# Patient Record
Sex: Female | Born: 1945 | Race: White | Hispanic: No | Marital: Married | Smoking: Never smoker
Health system: Southern US, Community
[De-identification: ages and names within clinical notes are randomized; demographics above are authoritative.]

## PROBLEM LIST (undated history)

## (undated) DIAGNOSIS — M810 Age-related osteoporosis without current pathological fracture: Secondary | ICD-10-CM

## (undated) DIAGNOSIS — N390 Urinary tract infection, site not specified: Secondary | ICD-10-CM

## (undated) DIAGNOSIS — I480 Paroxysmal atrial fibrillation: Secondary | ICD-10-CM

## (undated) DIAGNOSIS — E559 Vitamin D deficiency, unspecified: Secondary | ICD-10-CM

## (undated) HISTORY — PX: VAGINAL HYSTERECTOMY: SUR661

## (undated) HISTORY — PX: BLADDER SURGERY: SHX569

## (undated) HISTORY — DX: Paroxysmal atrial fibrillation: I48.0

## (undated) HISTORY — DX: Urinary tract infection, site not specified: N39.0

## (undated) HISTORY — DX: Age-related osteoporosis without current pathological fracture: M81.0

## (undated) HISTORY — DX: Vitamin D deficiency, unspecified: E55.9

---

## 1997-08-03 ENCOUNTER — Other Ambulatory Visit: Admission: RE | Admit: 1997-08-03 | Discharge: 1997-08-03 | Payer: Self-pay | Admitting: Obstetrics and Gynecology

## 1997-09-29 ENCOUNTER — Other Ambulatory Visit: Admission: RE | Admit: 1997-09-29 | Discharge: 1997-09-29 | Payer: Self-pay | Admitting: Obstetrics and Gynecology

## 1998-03-29 ENCOUNTER — Other Ambulatory Visit: Admission: RE | Admit: 1998-03-29 | Discharge: 1998-03-29 | Payer: Self-pay | Admitting: Obstetrics and Gynecology

## 1998-05-18 ENCOUNTER — Inpatient Hospital Stay (HOSPITAL_COMMUNITY): Admission: RE | Admit: 1998-05-18 | Discharge: 1998-05-19 | Payer: Self-pay | Admitting: Obstetrics and Gynecology

## 1999-07-05 ENCOUNTER — Other Ambulatory Visit: Admission: RE | Admit: 1999-07-05 | Discharge: 1999-07-05 | Payer: Self-pay | Admitting: Obstetrics and Gynecology

## 2000-09-28 ENCOUNTER — Other Ambulatory Visit: Admission: RE | Admit: 2000-09-28 | Discharge: 2000-09-28 | Payer: Self-pay | Admitting: Obstetrics and Gynecology

## 2002-03-11 ENCOUNTER — Other Ambulatory Visit: Admission: RE | Admit: 2002-03-11 | Discharge: 2002-03-11 | Payer: Self-pay | Admitting: Obstetrics and Gynecology

## 2003-07-31 ENCOUNTER — Encounter: Admission: RE | Admit: 2003-07-31 | Discharge: 2003-07-31 | Payer: Self-pay | Admitting: Obstetrics and Gynecology

## 2005-02-23 ENCOUNTER — Other Ambulatory Visit: Admission: RE | Admit: 2005-02-23 | Discharge: 2005-02-23 | Payer: Self-pay | Admitting: Obstetrics and Gynecology

## 2013-06-02 ENCOUNTER — Other Ambulatory Visit: Payer: Self-pay | Admitting: Orthopaedic Surgery

## 2013-06-02 DIAGNOSIS — S22009A Unspecified fracture of unspecified thoracic vertebra, initial encounter for closed fracture: Secondary | ICD-10-CM

## 2013-08-29 ENCOUNTER — Other Ambulatory Visit: Payer: Self-pay | Admitting: Obstetrics and Gynecology

## 2013-08-29 DIAGNOSIS — R928 Other abnormal and inconclusive findings on diagnostic imaging of breast: Secondary | ICD-10-CM

## 2013-09-08 ENCOUNTER — Ambulatory Visit
Admission: RE | Admit: 2013-09-08 | Discharge: 2013-09-08 | Disposition: A | Discharge status: Home / Self Care | Payer: Medicare PPO | Source: Ambulatory Visit | Attending: Obstetrics and Gynecology | Admitting: Obstetrics and Gynecology

## 2013-09-08 ENCOUNTER — Encounter (INDEPENDENT_AMBULATORY_CARE_PROVIDER_SITE_OTHER): Payer: Self-pay

## 2013-09-08 ENCOUNTER — Ambulatory Visit
Admission: RE | Admit: 2013-09-08 | Discharge: 2013-09-08 | Disposition: A | Discharge status: Home / Self Care | Payer: Self-pay | Source: Ambulatory Visit | Attending: Obstetrics and Gynecology | Admitting: Obstetrics and Gynecology

## 2013-09-08 DIAGNOSIS — R928 Other abnormal and inconclusive findings on diagnostic imaging of breast: Secondary | ICD-10-CM

## 2014-09-02 ENCOUNTER — Other Ambulatory Visit: Payer: Self-pay | Admitting: Family Medicine

## 2014-09-02 DIAGNOSIS — E785 Hyperlipidemia, unspecified: Secondary | ICD-10-CM | POA: Diagnosis not present

## 2014-09-02 DIAGNOSIS — R922 Inconclusive mammogram: Secondary | ICD-10-CM | POA: Diagnosis not present

## 2014-09-02 DIAGNOSIS — N390 Urinary tract infection, site not specified: Secondary | ICD-10-CM | POA: Diagnosis not present

## 2014-09-02 DIAGNOSIS — Z1231 Encounter for screening mammogram for malignant neoplasm of breast: Secondary | ICD-10-CM

## 2014-09-02 DIAGNOSIS — E038 Other specified hypothyroidism: Secondary | ICD-10-CM | POA: Diagnosis not present

## 2014-10-01 ENCOUNTER — Ambulatory Visit
Admission: RE | Admit: 2014-10-01 | Discharge: 2014-10-01 | Disposition: A | Discharge status: Home / Self Care | Payer: Medicare PPO | Source: Ambulatory Visit | Attending: Family Medicine | Admitting: Family Medicine

## 2014-10-01 DIAGNOSIS — Z1231 Encounter for screening mammogram for malignant neoplasm of breast: Secondary | ICD-10-CM

## 2014-11-19 DIAGNOSIS — J189 Pneumonia, unspecified organism: Secondary | ICD-10-CM | POA: Diagnosis not present

## 2014-11-19 DIAGNOSIS — B373 Candidiasis of vulva and vagina: Secondary | ICD-10-CM | POA: Diagnosis not present

## 2014-11-19 DIAGNOSIS — J301 Allergic rhinitis due to pollen: Secondary | ICD-10-CM | POA: Diagnosis not present

## 2014-11-25 DIAGNOSIS — Z6823 Body mass index (BMI) 23.0-23.9, adult: Secondary | ICD-10-CM | POA: Diagnosis not present

## 2014-11-25 DIAGNOSIS — J301 Allergic rhinitis due to pollen: Secondary | ICD-10-CM | POA: Diagnosis not present

## 2014-11-25 DIAGNOSIS — J189 Pneumonia, unspecified organism: Secondary | ICD-10-CM | POA: Diagnosis not present

## 2015-04-13 DIAGNOSIS — M546 Pain in thoracic spine: Secondary | ICD-10-CM | POA: Diagnosis not present

## 2015-04-29 DIAGNOSIS — Z780 Asymptomatic menopausal state: Secondary | ICD-10-CM | POA: Diagnosis not present

## 2015-05-04 DIAGNOSIS — M546 Pain in thoracic spine: Secondary | ICD-10-CM | POA: Diagnosis not present

## 2015-05-06 DIAGNOSIS — M546 Pain in thoracic spine: Secondary | ICD-10-CM | POA: Diagnosis not present

## 2015-05-06 DIAGNOSIS — M810 Age-related osteoporosis without current pathological fracture: Secondary | ICD-10-CM | POA: Diagnosis not present

## 2015-05-10 DIAGNOSIS — M546 Pain in thoracic spine: Secondary | ICD-10-CM | POA: Diagnosis not present

## 2015-05-10 DIAGNOSIS — M8580 Other specified disorders of bone density and structure, unspecified site: Secondary | ICD-10-CM | POA: Diagnosis not present

## 2015-05-10 DIAGNOSIS — M810 Age-related osteoporosis without current pathological fracture: Secondary | ICD-10-CM | POA: Diagnosis not present

## 2015-05-12 DIAGNOSIS — M546 Pain in thoracic spine: Secondary | ICD-10-CM | POA: Diagnosis not present

## 2015-05-17 DIAGNOSIS — M546 Pain in thoracic spine: Secondary | ICD-10-CM | POA: Diagnosis not present

## 2015-05-19 DIAGNOSIS — M546 Pain in thoracic spine: Secondary | ICD-10-CM | POA: Diagnosis not present

## 2015-05-24 DIAGNOSIS — M546 Pain in thoracic spine: Secondary | ICD-10-CM | POA: Diagnosis not present

## 2015-05-26 DIAGNOSIS — M546 Pain in thoracic spine: Secondary | ICD-10-CM | POA: Diagnosis not present

## 2015-06-02 DIAGNOSIS — M546 Pain in thoracic spine: Secondary | ICD-10-CM | POA: Diagnosis not present

## 2015-08-19 DIAGNOSIS — E559 Vitamin D deficiency, unspecified: Secondary | ICD-10-CM | POA: Diagnosis not present

## 2015-08-19 DIAGNOSIS — R8290 Unspecified abnormal findings in urine: Secondary | ICD-10-CM | POA: Diagnosis not present

## 2015-08-19 DIAGNOSIS — R079 Chest pain, unspecified: Secondary | ICD-10-CM | POA: Diagnosis not present

## 2015-08-19 DIAGNOSIS — Z79899 Other long term (current) drug therapy: Secondary | ICD-10-CM | POA: Diagnosis not present

## 2015-08-19 DIAGNOSIS — E038 Other specified hypothyroidism: Secondary | ICD-10-CM | POA: Diagnosis not present

## 2015-08-19 DIAGNOSIS — R5382 Chronic fatigue, unspecified: Secondary | ICD-10-CM | POA: Diagnosis not present

## 2015-08-19 DIAGNOSIS — E785 Hyperlipidemia, unspecified: Secondary | ICD-10-CM | POA: Diagnosis not present

## 2015-08-19 DIAGNOSIS — Z6823 Body mass index (BMI) 23.0-23.9, adult: Secondary | ICD-10-CM | POA: Diagnosis not present

## 2015-08-19 DIAGNOSIS — Z0000 Encounter for general adult medical examination without abnormal findings: Secondary | ICD-10-CM | POA: Diagnosis not present

## 2015-10-01 DIAGNOSIS — N390 Urinary tract infection, site not specified: Secondary | ICD-10-CM | POA: Diagnosis not present

## 2015-10-01 DIAGNOSIS — Z9181 History of falling: Secondary | ICD-10-CM | POA: Diagnosis not present

## 2015-10-01 DIAGNOSIS — Z6823 Body mass index (BMI) 23.0-23.9, adult: Secondary | ICD-10-CM | POA: Diagnosis not present

## 2015-10-01 DIAGNOSIS — B373 Candidiasis of vulva and vagina: Secondary | ICD-10-CM | POA: Diagnosis not present

## 2016-02-07 DIAGNOSIS — E559 Vitamin D deficiency, unspecified: Secondary | ICD-10-CM | POA: Diagnosis not present

## 2016-02-07 DIAGNOSIS — I10 Essential (primary) hypertension: Secondary | ICD-10-CM | POA: Diagnosis not present

## 2016-02-07 DIAGNOSIS — E785 Hyperlipidemia, unspecified: Secondary | ICD-10-CM | POA: Diagnosis not present

## 2016-02-07 DIAGNOSIS — M549 Dorsalgia, unspecified: Secondary | ICD-10-CM | POA: Diagnosis not present

## 2016-02-07 DIAGNOSIS — N390 Urinary tract infection, site not specified: Secondary | ICD-10-CM | POA: Diagnosis not present

## 2016-02-07 DIAGNOSIS — Z79899 Other long term (current) drug therapy: Secondary | ICD-10-CM | POA: Diagnosis not present

## 2016-09-04 DIAGNOSIS — E063 Autoimmune thyroiditis: Secondary | ICD-10-CM | POA: Diagnosis not present

## 2016-09-04 DIAGNOSIS — M818 Other osteoporosis without current pathological fracture: Secondary | ICD-10-CM | POA: Diagnosis not present

## 2016-09-04 DIAGNOSIS — F329 Major depressive disorder, single episode, unspecified: Secondary | ICD-10-CM | POA: Diagnosis not present

## 2016-09-04 DIAGNOSIS — Z79899 Other long term (current) drug therapy: Secondary | ICD-10-CM | POA: Diagnosis not present

## 2016-09-04 DIAGNOSIS — I10 Essential (primary) hypertension: Secondary | ICD-10-CM | POA: Diagnosis not present

## 2016-09-04 DIAGNOSIS — E559 Vitamin D deficiency, unspecified: Secondary | ICD-10-CM | POA: Diagnosis not present

## 2016-09-04 DIAGNOSIS — E538 Deficiency of other specified B group vitamins: Secondary | ICD-10-CM | POA: Diagnosis not present

## 2016-09-04 DIAGNOSIS — Z6823 Body mass index (BMI) 23.0-23.9, adult: Secondary | ICD-10-CM | POA: Diagnosis not present

## 2016-09-04 DIAGNOSIS — E785 Hyperlipidemia, unspecified: Secondary | ICD-10-CM | POA: Diagnosis not present

## 2016-10-02 DIAGNOSIS — N390 Urinary tract infection, site not specified: Secondary | ICD-10-CM | POA: Diagnosis not present

## 2016-10-02 DIAGNOSIS — E559 Vitamin D deficiency, unspecified: Secondary | ICD-10-CM | POA: Diagnosis not present

## 2016-10-02 DIAGNOSIS — E063 Autoimmune thyroiditis: Secondary | ICD-10-CM | POA: Diagnosis not present

## 2016-10-02 DIAGNOSIS — I10 Essential (primary) hypertension: Secondary | ICD-10-CM | POA: Diagnosis not present

## 2016-10-02 DIAGNOSIS — F329 Major depressive disorder, single episode, unspecified: Secondary | ICD-10-CM | POA: Diagnosis not present

## 2016-10-02 DIAGNOSIS — E785 Hyperlipidemia, unspecified: Secondary | ICD-10-CM | POA: Diagnosis not present

## 2016-10-02 DIAGNOSIS — M818 Other osteoporosis without current pathological fracture: Secondary | ICD-10-CM | POA: Diagnosis not present

## 2016-10-02 DIAGNOSIS — R300 Dysuria: Secondary | ICD-10-CM | POA: Diagnosis not present

## 2016-10-02 DIAGNOSIS — J301 Allergic rhinitis due to pollen: Secondary | ICD-10-CM | POA: Diagnosis not present

## 2016-10-20 DIAGNOSIS — B3749 Other urogenital candidiasis: Secondary | ICD-10-CM | POA: Diagnosis not present

## 2016-12-06 DIAGNOSIS — E063 Autoimmune thyroiditis: Secondary | ICD-10-CM | POA: Diagnosis not present

## 2017-03-01 DIAGNOSIS — M545 Low back pain: Secondary | ICD-10-CM | POA: Diagnosis not present

## 2017-03-01 DIAGNOSIS — M546 Pain in thoracic spine: Secondary | ICD-10-CM | POA: Diagnosis not present

## 2017-03-01 DIAGNOSIS — M4716 Other spondylosis with myelopathy, lumbar region: Secondary | ICD-10-CM | POA: Diagnosis not present

## 2017-03-01 DIAGNOSIS — Z6824 Body mass index (BMI) 24.0-24.9, adult: Secondary | ICD-10-CM | POA: Diagnosis not present

## 2017-03-07 ENCOUNTER — Other Ambulatory Visit: Payer: Self-pay | Admitting: Orthopaedic Surgery

## 2017-03-07 DIAGNOSIS — M4716 Other spondylosis with myelopathy, lumbar region: Secondary | ICD-10-CM

## 2017-03-11 ENCOUNTER — Ambulatory Visit
Admission: RE | Admit: 2017-03-11 | Discharge: 2017-03-11 | Disposition: A | Discharge status: Home / Self Care | Payer: Medicare PPO | Source: Ambulatory Visit | Attending: Orthopaedic Surgery | Admitting: Orthopaedic Surgery

## 2017-03-11 DIAGNOSIS — M48061 Spinal stenosis, lumbar region without neurogenic claudication: Secondary | ICD-10-CM | POA: Diagnosis not present

## 2017-03-11 DIAGNOSIS — M4716 Other spondylosis with myelopathy, lumbar region: Secondary | ICD-10-CM

## 2017-03-15 DIAGNOSIS — M545 Low back pain: Secondary | ICD-10-CM | POA: Diagnosis not present

## 2017-03-15 DIAGNOSIS — M5416 Radiculopathy, lumbar region: Secondary | ICD-10-CM | POA: Diagnosis not present

## 2017-03-15 DIAGNOSIS — M47816 Spondylosis without myelopathy or radiculopathy, lumbar region: Secondary | ICD-10-CM | POA: Diagnosis not present

## 2017-04-10 DIAGNOSIS — M5416 Radiculopathy, lumbar region: Secondary | ICD-10-CM | POA: Diagnosis not present

## 2017-05-02 DIAGNOSIS — M5416 Radiculopathy, lumbar region: Secondary | ICD-10-CM | POA: Diagnosis not present

## 2017-05-23 DIAGNOSIS — M545 Low back pain: Secondary | ICD-10-CM | POA: Diagnosis not present

## 2017-05-23 DIAGNOSIS — M47816 Spondylosis without myelopathy or radiculopathy, lumbar region: Secondary | ICD-10-CM | POA: Diagnosis not present

## 2017-05-23 DIAGNOSIS — G894 Chronic pain syndrome: Secondary | ICD-10-CM | POA: Diagnosis not present

## 2017-05-29 DIAGNOSIS — B373 Candidiasis of vulva and vagina: Secondary | ICD-10-CM | POA: Diagnosis not present

## 2017-05-29 DIAGNOSIS — I10 Essential (primary) hypertension: Secondary | ICD-10-CM | POA: Diagnosis not present

## 2017-05-29 DIAGNOSIS — E785 Hyperlipidemia, unspecified: Secondary | ICD-10-CM | POA: Diagnosis not present

## 2017-05-29 DIAGNOSIS — R300 Dysuria: Secondary | ICD-10-CM | POA: Diagnosis not present

## 2017-05-29 DIAGNOSIS — M545 Low back pain: Secondary | ICD-10-CM | POA: Diagnosis not present

## 2017-05-29 DIAGNOSIS — Z6824 Body mass index (BMI) 24.0-24.9, adult: Secondary | ICD-10-CM | POA: Diagnosis not present

## 2017-05-29 DIAGNOSIS — E063 Autoimmune thyroiditis: Secondary | ICD-10-CM | POA: Diagnosis not present

## 2017-05-29 DIAGNOSIS — N390 Urinary tract infection, site not specified: Secondary | ICD-10-CM | POA: Diagnosis not present

## 2017-06-11 DIAGNOSIS — M47816 Spondylosis without myelopathy or radiculopathy, lumbar region: Secondary | ICD-10-CM | POA: Diagnosis not present

## 2017-07-09 DIAGNOSIS — M47816 Spondylosis without myelopathy or radiculopathy, lumbar region: Secondary | ICD-10-CM | POA: Diagnosis not present

## 2017-07-24 DIAGNOSIS — M47816 Spondylosis without myelopathy or radiculopathy, lumbar region: Secondary | ICD-10-CM | POA: Diagnosis not present

## 2017-08-27 DIAGNOSIS — J019 Acute sinusitis, unspecified: Secondary | ICD-10-CM | POA: Diagnosis not present

## 2017-08-27 DIAGNOSIS — E538 Deficiency of other specified B group vitamins: Secondary | ICD-10-CM | POA: Diagnosis not present

## 2017-08-27 DIAGNOSIS — E063 Autoimmune thyroiditis: Secondary | ICD-10-CM | POA: Diagnosis not present

## 2017-08-27 DIAGNOSIS — I10 Essential (primary) hypertension: Secondary | ICD-10-CM | POA: Diagnosis not present

## 2017-08-27 DIAGNOSIS — M545 Low back pain: Secondary | ICD-10-CM | POA: Diagnosis not present

## 2017-08-27 DIAGNOSIS — M47816 Spondylosis without myelopathy or radiculopathy, lumbar region: Secondary | ICD-10-CM | POA: Diagnosis not present

## 2017-08-27 DIAGNOSIS — E785 Hyperlipidemia, unspecified: Secondary | ICD-10-CM | POA: Diagnosis not present

## 2017-08-27 DIAGNOSIS — Z6823 Body mass index (BMI) 23.0-23.9, adult: Secondary | ICD-10-CM | POA: Diagnosis not present

## 2017-08-27 DIAGNOSIS — Z1339 Encounter for screening examination for other mental health and behavioral disorders: Secondary | ICD-10-CM | POA: Diagnosis not present

## 2017-08-27 DIAGNOSIS — E559 Vitamin D deficiency, unspecified: Secondary | ICD-10-CM | POA: Diagnosis not present

## 2018-07-25 DIAGNOSIS — B349 Viral infection, unspecified: Secondary | ICD-10-CM | POA: Diagnosis not present

## 2018-07-25 DIAGNOSIS — B373 Candidiasis of vulva and vagina: Secondary | ICD-10-CM | POA: Diagnosis not present

## 2018-07-25 DIAGNOSIS — J029 Acute pharyngitis, unspecified: Secondary | ICD-10-CM | POA: Diagnosis not present

## 2018-07-25 DIAGNOSIS — J020 Streptococcal pharyngitis: Secondary | ICD-10-CM | POA: Diagnosis not present

## 2018-12-19 DIAGNOSIS — E538 Deficiency of other specified B group vitamins: Secondary | ICD-10-CM | POA: Diagnosis not present

## 2018-12-19 DIAGNOSIS — E785 Hyperlipidemia, unspecified: Secondary | ICD-10-CM | POA: Diagnosis not present

## 2018-12-19 DIAGNOSIS — E559 Vitamin D deficiency, unspecified: Secondary | ICD-10-CM | POA: Diagnosis not present

## 2018-12-19 DIAGNOSIS — I10 Essential (primary) hypertension: Secondary | ICD-10-CM | POA: Diagnosis not present

## 2018-12-19 DIAGNOSIS — J301 Allergic rhinitis due to pollen: Secondary | ICD-10-CM | POA: Diagnosis not present

## 2018-12-19 DIAGNOSIS — M545 Low back pain: Secondary | ICD-10-CM | POA: Diagnosis not present

## 2018-12-19 DIAGNOSIS — E063 Autoimmune thyroiditis: Secondary | ICD-10-CM | POA: Diagnosis not present

## 2018-12-19 DIAGNOSIS — F329 Major depressive disorder, single episode, unspecified: Secondary | ICD-10-CM | POA: Diagnosis not present

## 2018-12-19 DIAGNOSIS — Z9181 History of falling: Secondary | ICD-10-CM | POA: Diagnosis not present

## 2018-12-20 DIAGNOSIS — E063 Autoimmune thyroiditis: Secondary | ICD-10-CM | POA: Diagnosis not present

## 2018-12-20 DIAGNOSIS — E559 Vitamin D deficiency, unspecified: Secondary | ICD-10-CM | POA: Diagnosis not present

## 2018-12-20 DIAGNOSIS — E785 Hyperlipidemia, unspecified: Secondary | ICD-10-CM | POA: Diagnosis not present

## 2018-12-20 DIAGNOSIS — Z23 Encounter for immunization: Secondary | ICD-10-CM | POA: Diagnosis not present

## 2018-12-20 DIAGNOSIS — R739 Hyperglycemia, unspecified: Secondary | ICD-10-CM | POA: Diagnosis not present

## 2018-12-27 ENCOUNTER — Other Ambulatory Visit: Payer: Self-pay | Admitting: Family Medicine

## 2018-12-27 DIAGNOSIS — Z1231 Encounter for screening mammogram for malignant neoplasm of breast: Secondary | ICD-10-CM

## 2019-01-02 ENCOUNTER — Ambulatory Visit
Admission: RE | Admit: 2019-01-02 | Discharge: 2019-01-02 | Disposition: A | Discharge status: Home / Self Care | Payer: Medicare PPO | Source: Ambulatory Visit | Attending: Family Medicine | Admitting: Family Medicine

## 2019-01-02 ENCOUNTER — Other Ambulatory Visit: Payer: Self-pay

## 2019-01-02 DIAGNOSIS — Z1231 Encounter for screening mammogram for malignant neoplasm of breast: Secondary | ICD-10-CM

## 2019-02-17 DIAGNOSIS — N390 Urinary tract infection, site not specified: Secondary | ICD-10-CM | POA: Diagnosis not present

## 2019-02-17 DIAGNOSIS — R300 Dysuria: Secondary | ICD-10-CM | POA: Diagnosis not present

## 2019-05-09 DIAGNOSIS — R300 Dysuria: Secondary | ICD-10-CM | POA: Diagnosis not present

## 2019-05-09 DIAGNOSIS — Z6824 Body mass index (BMI) 24.0-24.9, adult: Secondary | ICD-10-CM | POA: Diagnosis not present

## 2019-05-09 DIAGNOSIS — K219 Gastro-esophageal reflux disease without esophagitis: Secondary | ICD-10-CM | POA: Diagnosis not present

## 2019-05-09 DIAGNOSIS — N390 Urinary tract infection, site not specified: Secondary | ICD-10-CM | POA: Diagnosis not present

## 2019-05-21 DIAGNOSIS — R300 Dysuria: Secondary | ICD-10-CM | POA: Diagnosis not present

## 2019-05-28 DIAGNOSIS — S060X1A Concussion with loss of consciousness of 30 minutes or less, initial encounter: Secondary | ICD-10-CM | POA: Diagnosis not present

## 2019-05-28 DIAGNOSIS — I6300 Cerebral infarction due to thrombosis of unspecified precerebral artery: Secondary | ICD-10-CM | POA: Diagnosis not present

## 2019-05-28 DIAGNOSIS — R220 Localized swelling, mass and lump, head: Secondary | ICD-10-CM | POA: Diagnosis not present

## 2019-05-28 DIAGNOSIS — S60512A Abrasion of left hand, initial encounter: Secondary | ICD-10-CM | POA: Diagnosis not present

## 2019-05-28 DIAGNOSIS — Z79899 Other long term (current) drug therapy: Secondary | ICD-10-CM | POA: Diagnosis not present

## 2019-05-28 DIAGNOSIS — S60519A Abrasion of unspecified hand, initial encounter: Secondary | ICD-10-CM | POA: Diagnosis not present

## 2019-05-28 DIAGNOSIS — S060X9A Concussion with loss of consciousness of unspecified duration, initial encounter: Secondary | ICD-10-CM | POA: Diagnosis not present

## 2019-05-28 DIAGNOSIS — I480 Paroxysmal atrial fibrillation: Secondary | ICD-10-CM | POA: Diagnosis not present

## 2019-05-28 DIAGNOSIS — R55 Syncope and collapse: Secondary | ICD-10-CM | POA: Diagnosis not present

## 2019-05-28 DIAGNOSIS — S6992XA Unspecified injury of left wrist, hand and finger(s), initial encounter: Secondary | ICD-10-CM | POA: Diagnosis not present

## 2019-05-28 DIAGNOSIS — R4182 Altered mental status, unspecified: Secondary | ICD-10-CM | POA: Diagnosis not present

## 2019-05-28 DIAGNOSIS — M899 Disorder of bone, unspecified: Secondary | ICD-10-CM | POA: Diagnosis not present

## 2019-05-28 DIAGNOSIS — N3000 Acute cystitis without hematuria: Secondary | ICD-10-CM | POA: Diagnosis not present

## 2019-05-28 DIAGNOSIS — E039 Hypothyroidism, unspecified: Secondary | ICD-10-CM | POA: Diagnosis not present

## 2019-05-28 DIAGNOSIS — I10 Essential (primary) hypertension: Secondary | ICD-10-CM | POA: Diagnosis not present

## 2019-05-29 DIAGNOSIS — I361 Nonrheumatic tricuspid (valve) insufficiency: Secondary | ICD-10-CM | POA: Diagnosis not present

## 2019-05-29 DIAGNOSIS — S0990XA Unspecified injury of head, initial encounter: Secondary | ICD-10-CM | POA: Diagnosis not present

## 2019-05-29 DIAGNOSIS — I340 Nonrheumatic mitral (valve) insufficiency: Secondary | ICD-10-CM | POA: Diagnosis not present

## 2019-05-29 DIAGNOSIS — I63233 Cerebral infarction due to unspecified occlusion or stenosis of bilateral carotid arteries: Secondary | ICD-10-CM | POA: Diagnosis not present

## 2019-05-29 DIAGNOSIS — R4182 Altered mental status, unspecified: Secondary | ICD-10-CM | POA: Diagnosis not present

## 2019-05-29 DIAGNOSIS — S60519A Abrasion of unspecified hand, initial encounter: Secondary | ICD-10-CM | POA: Diagnosis not present

## 2019-05-29 DIAGNOSIS — I6523 Occlusion and stenosis of bilateral carotid arteries: Secondary | ICD-10-CM | POA: Diagnosis not present

## 2019-05-29 DIAGNOSIS — I639 Cerebral infarction, unspecified: Secondary | ICD-10-CM | POA: Diagnosis not present

## 2019-05-29 DIAGNOSIS — N390 Urinary tract infection, site not specified: Secondary | ICD-10-CM | POA: Diagnosis not present

## 2019-05-29 DIAGNOSIS — S060X9A Concussion with loss of consciousness of unspecified duration, initial encounter: Secondary | ICD-10-CM | POA: Diagnosis not present

## 2019-05-29 DIAGNOSIS — M898X8 Other specified disorders of bone, other site: Secondary | ICD-10-CM | POA: Diagnosis not present

## 2019-05-29 DIAGNOSIS — R55 Syncope and collapse: Secondary | ICD-10-CM | POA: Diagnosis not present

## 2019-06-11 DIAGNOSIS — I10 Essential (primary) hypertension: Secondary | ICD-10-CM | POA: Diagnosis not present

## 2019-06-11 DIAGNOSIS — R739 Hyperglycemia, unspecified: Secondary | ICD-10-CM | POA: Diagnosis not present

## 2019-06-11 DIAGNOSIS — E538 Deficiency of other specified B group vitamins: Secondary | ICD-10-CM | POA: Diagnosis not present

## 2019-06-11 DIAGNOSIS — E559 Vitamin D deficiency, unspecified: Secondary | ICD-10-CM | POA: Diagnosis not present

## 2019-06-11 DIAGNOSIS — E785 Hyperlipidemia, unspecified: Secondary | ICD-10-CM | POA: Diagnosis not present

## 2019-06-11 DIAGNOSIS — E063 Autoimmune thyroiditis: Secondary | ICD-10-CM | POA: Diagnosis not present

## 2019-06-11 DIAGNOSIS — R300 Dysuria: Secondary | ICD-10-CM | POA: Diagnosis not present

## 2019-06-11 DIAGNOSIS — R55 Syncope and collapse: Secondary | ICD-10-CM | POA: Diagnosis not present

## 2019-06-11 DIAGNOSIS — Z87440 Personal history of urinary (tract) infections: Secondary | ICD-10-CM | POA: Diagnosis not present

## 2019-06-11 DIAGNOSIS — Z79899 Other long term (current) drug therapy: Secondary | ICD-10-CM | POA: Diagnosis not present

## 2019-07-02 ENCOUNTER — Other Ambulatory Visit: Payer: Self-pay

## 2019-07-02 ENCOUNTER — Ambulatory Visit (INDEPENDENT_AMBULATORY_CARE_PROVIDER_SITE_OTHER): Payer: Medicare PPO

## 2019-07-02 ENCOUNTER — Encounter: Payer: Self-pay | Admitting: Cardiology

## 2019-07-02 ENCOUNTER — Ambulatory Visit: Payer: Medicare PPO | Admitting: Cardiology

## 2019-07-02 VITALS — BP 138/84 | HR 89 | Temp 97.3°F | Ht 65.0 in | Wt 145.6 lb

## 2019-07-02 DIAGNOSIS — R55 Syncope and collapse: Secondary | ICD-10-CM

## 2019-07-02 DIAGNOSIS — E785 Hyperlipidemia, unspecified: Secondary | ICD-10-CM

## 2019-07-02 DIAGNOSIS — R0602 Shortness of breath: Secondary | ICD-10-CM

## 2019-07-02 DIAGNOSIS — E039 Hypothyroidism, unspecified: Secondary | ICD-10-CM | POA: Diagnosis not present

## 2019-07-02 DIAGNOSIS — I10 Essential (primary) hypertension: Secondary | ICD-10-CM | POA: Diagnosis not present

## 2019-07-02 NOTE — Progress Notes (Signed)
Cardiology Office Note:    Date:  07/02/2019   ID:  Jill Little, DOB 03-31-1945, MRN [id number]  PCP:  Jill Councilman, FNP  Cardiologist:  Jill Herrlich, MD   Referring MD: No ref. provider found  ASSESSMENT:    1. Syncope and collapse   2. Essential hypertension   3. Acquired hypothyroidism   4. Hyperlipidemia, unspecified hyperlipidemia type   5. Shortness of breath    PLAN:    In order of problems listed above:  1. She had a very profound event. The differential diagnosis includes a true fall with concussion or a marked bradycardic event. He is at increased risk with a history of atrial fibrillation on documented in the past. He is a 4-week line event monitor to screen her at the same time evaluate for underlying CAD with new symptoms of exertional shortness of breath in the last year. She has marked family history of CAD and cardiac death later in life. I do not think she had a seizure. All may be explained by fall head trauma and concussion. Does not drive a car and said she would not drive one until seen back in the office at this time I do not think she requires an implanted loop recorder as the first procedure. 2. Stable BP at target continue current beta-blocker 3. Continue current fenofibrate for hyperlipidemia thyroid supplement for hypothyroidism 4. May represent anginal equivalent check myocardial perfusion study   Next appointment 6 weeks   Medication Adjustments/Labs and Tests Ordered: Current medicines are reviewed at length with the patient today.  Concerns regarding medicines are outlined above.  Orders Placed This Encounter  Procedures  . LONG TERM MONITOR (3-14 DAYS)  . LONG TERM MONITOR (3-14 DAYS)  . MYOCARDIAL PERFUSION IMAGING   No orders of the defined types were placed in this encounter.    Chief Complaint  Patient presents with  . Follow-up    [facility] with a profound syncopal episode    History of Present Illness:    Jill Little is a 74 y.o. female who is being seen today for the evaluation of syncope at the request of No ref. provider found. She was admitted to [facility] 3/1821discharged the same service today with a diagnosis of syncope.  There is a notation that she is referred to cardiology for implantation of a loop recorder.  There were other complaints of difficulty with speech and thought and she underwent an MRI of the brain.  Ultrasound of carotids showed mild less than 50% stenosis echocardiogram showed low normal ejection fraction 50 to 55% otherwise normal CT scan of the head raise concern for abnormality in the left frontal area not confirmed on MRI.  Laboratory studies included mild anemia hemoglobin 11.6 normal renal function troponins were assessed and found to be normal D-dimer was elevated.  Her EKG showed sinus rhythm.  Her medical history is listed as hypertension and hypothyroidism.  She confirms I did not see her in the hospital to the best of my knowledge I had no discussion with physicians. Her history is that 2 days before the ED visit she had a very profound episode of collapse. She was in a hurry she called out to her daughter on falling was not able to put her hands out to brace herself and fell and struck her face and from her discussion was unconscious for up to 5 to 10 minutes and afterwards her thought process was clear. An ambulance was called she  does not think an ECG was done and she did go to the hospital. She was not normal afterwards with trouble with her memory of thought process and speech and the family brought her to the emergency room to be evaluated for stroke. She has never fainted before she has not had another episode and she has never had a seizure. The episode was witnessed and there was no ictal activity or incontinence. She was having urinary tract infection and at the time of the event she was taking Cipro. Her EKG in the emergency room did not show prolonged QT interval.  There was a discussion with somebody from cardiology recommendation she has an implanted loop recorder and she sees me regarding it. I told her before an implanted loop recorder I would use the live Zio patch monitor for 4 weeks and also with her symptoms of exertional shortness of breath with significant and family history of CAD and profound episode screen for CAD with a myocardial perfusion study. Echocardiogram did not show any significant valvular abnormality or cardio myopathy. She has a history in the past she says is having atrial fibrillation never anticoagulated no antiarrhythmic drugs and is occasional palpitation not severe not sustained. She has no history of congenital rheumatic heart disease or heart failure.  Past Medical History:  Diagnosis Date  . Osteoporosis   . Paroxysmal atrial fibrillation (HCC)   . Urinary tract infection   . Vitamin D deficiency     Past Surgical History:  Procedure Laterality Date  . BLADDER SURGERY    . VAGINAL HYSTERECTOMY      Current Medications: Current Meds  Medication Sig  . Cholecalciferol (VITAMIN D3) 1.25 MG (50000 UT) TABS Take 5,000 Units by mouth daily.  . DULoxetine (CYMBALTA) 60 MG capsule Take 60 mg by mouth daily.  . fenofibrate (TRICOR) 145 MG tablet Take 145 mg by mouth daily.  Marland Kitchen levothyroxine (SYNTHROID) 50 MCG tablet Take 50 mcg by mouth every other day.  . levothyroxine (SYNTHROID) 75 MCG tablet Take 75 mcg by mouth every other day.  . Methylcobalamin (B12-ACTIVE PO) Take by mouth.  . metoprolol succinate (TOPROL-XL) 50 MG 24 hr tablet Take 50 mg by mouth daily.  . TURMERIC PO Take 1,500 mg by mouth daily.     Allergies:   Sulfamethoxazole   Social History   Socioeconomic History  . Marital status: Married    Spouse name: Not on file  . Number of children: Not on file  . Years of education: Not on file  . Highest education level: Not on file  Occupational History  . Not on file  Tobacco Use  . Smoking status:  Never Smoker  . Smokeless tobacco: Never Used  Substance and Sexual Activity  . Alcohol use: Never  . Drug use: Never  . Sexual activity: Not on file  Other Topics Concern  . Not on file  Social History Narrative  . Not on file   Social Determinants of Health   Financial Resource Strain:   . Difficulty of Paying Living Expenses:   Food Insecurity:   . Worried About Charity fundraiser in the Last Year:   . Arboriculturist in the Last Year:   Transportation Needs:   . Film/video editor (Medical):   Marland Kitchen Lack of Transportation (Non-Medical):   Physical Activity:   . Days of Exercise per Week:   . Minutes of Exercise per Session:   Stress:   . Feeling of Stress :  Social Connections:   . Frequency of Communication with Friends and Family:   . Frequency of Social Gatherings with Friends and Family:   . Attends Religious Services:   . Active Member of Clubs or Organizations:   . Attends Banker Meetings:   Marland Kitchen Marital Status:      Family History: The patient's family history includes Diabetes in her sister; Heart attack in her mother; Stroke in her father. Multiple family members have died of CAD ROS:   Review of Systems  Constitution: Negative.  HENT: Negative.   Eyes: Negative.   Cardiovascular: Positive for dyspnea on exertion, palpitations and syncope.  Respiratory: Positive for shortness of breath.   Endocrine: Negative.   Hematologic/Lymphatic: Negative.   Skin: Negative.   Musculoskeletal: Negative.   Gastrointestinal: Negative.   Genitourinary: Negative.   Psychiatric/Behavioral: Negative.   Allergic/Immunologic: Negative.    Please see the history of present illness.     All other systems reviewed and are negative.  EKGs/Labs/Other Studies Reviewed:    The following studies were reviewed today:   Physical Exam:    VS:  BP 138/84   Pulse 89   Temp (!) 97.3 F (36.3 C)   Ht 5\' 5"  (1.651 m)   Wt 145 lb 9.6 oz (66 kg)   SpO2 96%    BMI 24.23 kg/m     Wt Readings from Last 3 Encounters:  07/02/19 145 lb 9.6 oz (66 kg)     GEN:  Well nourished, well developed in no acute distress HEENT: Normal NECK: No JVD; No carotid bruits LYMPHATICS: No lymphadenopathy CARDIAC: RRR, no murmurs, rubs, gallops RESPIRATORY:  Clear to auscultation without rales, wheezing or rhonchi  ABDOMEN: Soft, non-tender, non-distended MUSCULOSKELETAL:  No edema; No deformity  SKIN: Warm and dry NEUROLOGIC:  Alert and oriented x 3 PSYCHIATRIC:  Normal affect     Signed, 07/04/19, MD  07/02/2019 4:21 PM    [facility]

## 2019-07-02 NOTE — Patient Instructions (Signed)
Medication Instructions:  Your physician recommends that you continue on your current medications as directed. Please refer to the Current Medication list given to you today.  *If you need a refill on your cardiac medications before your next appointment, please call your pharmacy*   Lab Work: None If you have labs (blood work) drawn today and your tests are completely normal, you will receive your results only by: MyChart Message (if you have MyChart) OR A paper copy in the mail If you have any lab test that is abnormal or we need to change your treatment, we will call you to review the results.   Testing/Procedures:   [facility] [address] [place], [place] [address] Phone:  [phone]    Please arrive 15 minutes prior to your appointment time for registration and insurance purposes.  The test will take approximately 3 to 4 hours to complete; you may bring reading material.  If someone comes with you to your appointment, they will need to remain in the main lobby due to limited space in the testing area. **If you are pregnant or breastfeeding, please notify the nuclear lab prior to your appointment**  How to prepare for your Myocardial Perfusion Test: Do not eat or drink 3 hours prior to your test, except you may have water. Do not consume products containing caffeine (regular or decaffeinated) 12 hours prior to your test. (ex: coffee, chocolate, sodas, tea). Do bring a list of your current medications with you.  If not listed below, you may take your medications as normal. Do wear comfortable clothes (no dresses or overalls) and walking shoes, tennis shoes preferred (No heels or open toe shoes are allowed). Do NOT wear cologne, perfume, aftershave, or lotions (deodorant is allowed). If these instructions are not followed, your test will have to be rescheduled.  Please report to [address] for your test.  If you have questions or  concerns about your appointment, you can call the [facility] at [phone].  If you cannot keep your appointment, please provide 24 hours notification to the Nuclear Lab, to avoid a possible $50 charge to your account.    Follow-Up: At [facility], you and your health needs are our priority.  As part of our continuing mission to provide you with exceptional heart care, we have created designated Provider Care Teams.  These Care Teams include your primary Cardiologist (physician) and Advanced Practice Providers (APPs -  Physician Assistants and Nurse Practitioners) who all work together to provide you with the care you need, when you need it.  We recommend signing up for the patient portal called "MyChart".  Sign up information is provided on this After Visit Summary.  MyChart is used to connect with patients for Virtual Visits (Telemedicine).  Patients are able to view lab/test results, encounter notes, upcoming appointments, etc.  Non-urgent messages can be sent to your provider as well.   To learn more about what you can do with MyChart, go to [email or web address].    Your next appointment:   6 week(s)  The format for your next appointment:   In Person  Provider:   Brian Munley, MD   Other Instructions   

## 2019-07-08 ENCOUNTER — Telehealth (HOSPITAL_COMMUNITY): Payer: Self-pay | Admitting: *Deleted

## 2019-07-08 NOTE — Telephone Encounter (Signed)
Left message on voicemail in reference to upcoming appointment scheduled for 07/10/19. Phone number given for a call back so details instructions can be given.  Jill Little

## 2019-07-10 ENCOUNTER — Telehealth: Payer: Self-pay

## 2019-07-10 ENCOUNTER — Ambulatory Visit (INDEPENDENT_AMBULATORY_CARE_PROVIDER_SITE_OTHER): Payer: Medicare PPO

## 2019-07-10 ENCOUNTER — Other Ambulatory Visit: Payer: Self-pay

## 2019-07-10 VITALS — Ht 65.0 in | Wt 145.0 lb

## 2019-07-10 DIAGNOSIS — R55 Syncope and collapse: Secondary | ICD-10-CM | POA: Diagnosis not present

## 2019-07-10 DIAGNOSIS — R0602 Shortness of breath: Secondary | ICD-10-CM | POA: Diagnosis not present

## 2019-07-10 LAB — MYOCARDIAL PERFUSION IMAGING
LV dias vol: 44 mL (ref 46–106)
LV sys vol: 13 mL
Peak HR: 109 {beats}/min
Rest HR: 86 {beats}/min
SDS: 2
SRS: 6
SSS: 8
TID: 0.92

## 2019-07-10 MED ORDER — TECHNETIUM TC 99M TETROFOSMIN IV KIT
32.3000 | PACK | Freq: Once | INTRAVENOUS | Status: AC | PRN
  Administered 2019-07-10: 32.3 via INTRAVENOUS

## 2019-07-10 MED ORDER — REGADENOSON 0.4 MG/5ML IV SOLN
0.4000 mg | Freq: Once | INTRAVENOUS | Status: AC
  Administered 2019-07-10: 0.4 mg via INTRAVENOUS

## 2019-07-10 MED ORDER — TECHNETIUM TC 99M TETROFOSMIN IV KIT
11.0000 | PACK | Freq: Once | INTRAVENOUS | Status: AC | PRN
  Administered 2019-07-10: 11 via INTRAVENOUS

## 2019-07-10 NOTE — Telephone Encounter (Signed)
Spoke with patient regarding results.  Patient verbalizes understanding and is agreeable to plan of care. Advised patient to call back with any issues or concerns.  

## 2019-07-10 NOTE — Telephone Encounter (Signed)
-----   Message from Baldo Daub, MD sent at 07/10/2019  3:26 PM EDT ----- Normal or stable result  Good result this is normal

## 2019-07-17 ENCOUNTER — Ambulatory Visit (INDEPENDENT_AMBULATORY_CARE_PROVIDER_SITE_OTHER): Payer: Medicare PPO

## 2019-07-17 DIAGNOSIS — R55 Syncope and collapse: Secondary | ICD-10-CM | POA: Diagnosis not present

## 2019-08-05 DIAGNOSIS — L02411 Cutaneous abscess of right axilla: Secondary | ICD-10-CM | POA: Diagnosis not present

## 2019-08-07 DIAGNOSIS — L03113 Cellulitis of right upper limb: Secondary | ICD-10-CM | POA: Diagnosis not present

## 2019-08-07 DIAGNOSIS — Z5189 Encounter for other specified aftercare: Secondary | ICD-10-CM | POA: Diagnosis not present

## 2019-08-08 DIAGNOSIS — R55 Syncope and collapse: Secondary | ICD-10-CM | POA: Diagnosis not present

## 2019-08-18 ENCOUNTER — Telehealth: Payer: Self-pay

## 2019-08-18 NOTE — Telephone Encounter (Signed)
Tried calling patient. No answer and no voicemail set up for me to leave a message. 

## 2019-08-18 NOTE — Telephone Encounter (Signed)
-----   Message from Baldo Daub, MD sent at 08/18/2019  8:49 AM EDT ----- Normal or stable result  This is a good report, although there are extra beats present none of this would cause an episode of losing consciousness or appears threatening.  We can discuss further at their office follow-up.

## 2019-08-19 NOTE — Telephone Encounter (Signed)
Tried calling patient. No answer and no voicemail set up for me to leave a message. 

## 2019-08-20 NOTE — Telephone Encounter (Signed)
Tried calling patient. No answer and no voicemail set up for me to leave a message. I will be mailing the patient a letter with these results at this time after trying to reach her x3 via phone with no results.

## 2019-08-22 DIAGNOSIS — N390 Urinary tract infection, site not specified: Secondary | ICD-10-CM | POA: Diagnosis not present

## 2019-08-25 NOTE — Progress Notes (Signed)
Cardiology Office Note:    Date:  08/26/2019   ID:  Jill Little, DOB 12-Jul-1945, MRN [id number]  PCP:  Gordy Councilman, FNP  Cardiologist:  Norman Herrlich, MD    Referring MD: Gordy Councilman, FNP    ASSESSMENT:    1. Syncope and collapse   2. Essential hypertension    PLAN:    In order of problems listed above:  1. At this time I do not think she requires implanted loop recorder and I told her I see the second event monitor and phone her. 2. Continue current antihypertensive treatment metoprolol 3. Statin intolerance she prefers no lipid-lowering treatment at this time   Next appointment: As needed   Medication Adjustments/Labs and Tests Ordered: Current medicines are reviewed at length with the patient today.  Concerns regarding medicines are outlined above.  No orders of the defined types were placed in this encounter.  No orders of the defined types were placed in this encounter.   Chief Complaint  Patient presents with  . Follow-up  . Loss of Consciousness    History of Present Illness:    Jill Little is a 74 y.o. female with a hx of syncope last seen 07/02/2019. Compliance with diet, lifestyle and medications: Yes I reviewed her testing with her myocardial perfusion study was normal and her first 2-week monitor showed no arrhythmia.  At this time I do not think she requires a loop recorder and I told her I did give her a call when I saw the results of the second.  She is reassured she has had no recurrent syncope or near syncope and I told her that her symptomatic palpitation was  atrial premature beats  07/10/2019: Myoview was normal Study Highlights  The left ventricular ejection fraction is hyperdynamic (>65%).  Nuclear stress EF: 71%.  There was no ST segment deviation noted during stress.  The study is normal.  This is a low risk study.  Event monitor: Study Highlights  A ZIO monitor was applied for 14 days beginning 07/02/2019 to assess  syncope. The predominant rhythm was sinus with average minimum and maximum heart rates of 77, 56 and 113 bpm. There were no pauses of 3 seconds or greater and no episodes of second or third-degree AV nodal block or sinus node exit block. There was 1 triggered and 1 diary event both associated with atrial premature contractions. Ventricular ectopy was rare with isolated PVCs Supraventricular ectopy was frequent atrial premature contractions.  There were no episodes of atrial fibrillation or flutter.  There were 3 brief runs of atrial premature contractions the longest 13 complexes at a rate of 128 bpm, paroxysmal atrial tachycardia.   Conclusion frequent supraventricular ectopy with frequent APCs.  Past Medical History:  Diagnosis Date  . Osteoporosis   . Paroxysmal atrial fibrillation (HCC)   . Urinary tract infection   . Vitamin D deficiency     Past Surgical History:  Procedure Laterality Date  . BLADDER SURGERY    . VAGINAL HYSTERECTOMY      Current Medications: Current Meds  Medication Sig  . cefUROXime (CEFTIN) 500 MG tablet 500 mg in the morning and at bedtime.  . Cholecalciferol (VITAMIN D3) 1.25 MG (50000 UT) TABS Take 5,000 Units by mouth daily.  . cyanocobalamin (,VITAMIN B-12,) 1000 MCG/ML injection Inject into the muscle every 30 (thirty) days.  . DULoxetine (CYMBALTA) 60 MG capsule Take 60 mg by mouth daily.  Marland Kitchen esomeprazole (NEXIUM) 20 MG capsule Take 20  mg by mouth daily.  . fenofibrate (TRICOR) 145 MG tablet Take 145 mg by mouth daily.  Marland Kitchen levothyroxine (SYNTHROID) 50 MCG tablet Take 50 mcg by mouth every other day.  . levothyroxine (SYNTHROID) 75 MCG tablet Take 75 mcg by mouth every other day.  . metoprolol succinate (TOPROL-XL) 50 MG 24 hr tablet Take 50 mg by mouth daily.  . TURMERIC PO Take 1,500 mg by mouth daily.     Allergies:   Sulfamethoxazole   Social History   Socioeconomic History  . Marital status: Married    Spouse name: Not on file  . Number  of children: Not on file  . Years of education: Not on file  . Highest education level: Not on file  Occupational History  . Not on file  Tobacco Use  . Smoking status: Never Smoker  . Smokeless tobacco: Never Used  Substance and Sexual Activity  . Alcohol use: Never  . Drug use: Never  . Sexual activity: Not on file  Other Topics Concern  . Not on file  Social History Narrative  . Not on file   Social Determinants of Health   Financial Resource Strain:   . Difficulty of Paying Living Expenses:   Food Insecurity:   . Worried About Charity fundraiser in the Last Year:   . Arboriculturist in the Last Year:   Transportation Needs:   . Film/video editor (Medical):   Marland Kitchen Lack of Transportation (Non-Medical):   Physical Activity:   . Days of Exercise per Week:   . Minutes of Exercise per Session:   Stress:   . Feeling of Stress :   Social Connections:   . Frequency of Communication with Friends and Family:   . Frequency of Social Gatherings with Friends and Family:   . Attends Religious Services:   . Active Member of Clubs or Organizations:   . Attends Archivist Meetings:   Marland Kitchen Marital Status:      Family History: The patient's family history includes Diabetes in her sister; Heart attack in her mother; Stroke in her father. ROS:   Please see the history of present illness.    All other systems reviewed and are negative.  EKGs/Labs/Other Studies Reviewed:    The following studies were reviewed today:    Recent Labs: 06/11/2019: Cholesterol 246 HDL 44 LDL 163 she is statin intolerant.  Creatinine 1.12   Physical Exam:    VS:  BP 140/82 (BP Location: Right Arm, Patient Position: Sitting, Cuff Size: Normal)   Pulse 78   Ht 5\' 5"  (1.651 m)   Wt 145 lb (65.8 kg)   SpO2 98%   BMI 24.13 kg/m     Wt Readings from Last 3 Encounters:  08/26/19 145 lb (65.8 kg)  07/10/19 145 lb (65.8 kg)  07/02/19 145 lb 9.6 oz (66 kg)     GEN:  Well nourished,  well developed in no acute distress HEENT: Normal NECK: No JVD; No carotid bruits LYMPHATICS: No lymphadenopathy CARDIAC: RRR, no murmurs, rubs, gallops RESPIRATORY:  Clear to auscultation without rales, wheezing or rhonchi  ABDOMEN: Soft, non-tender, non-distended MUSCULOSKELETAL:  No edema; No deformity  SKIN: Warm and dry NEUROLOGIC:  Alert and oriented x 3 PSYCHIATRIC:  Normal affect    Signed, Shirlee More, MD  08/26/2019 3:45 PM    [facility]

## 2019-08-26 ENCOUNTER — Encounter: Payer: Self-pay | Admitting: Cardiology

## 2019-08-26 ENCOUNTER — Ambulatory Visit: Payer: Medicare PPO | Admitting: Cardiology

## 2019-08-26 ENCOUNTER — Other Ambulatory Visit: Payer: Self-pay

## 2019-08-26 VITALS — BP 140/82 | HR 78 | Ht 65.0 in | Wt 145.0 lb

## 2019-08-26 DIAGNOSIS — R55 Syncope and collapse: Secondary | ICD-10-CM

## 2019-08-26 DIAGNOSIS — I10 Essential (primary) hypertension: Secondary | ICD-10-CM

## 2019-08-26 NOTE — Patient Instructions (Signed)

## 2019-08-27 ENCOUNTER — Other Ambulatory Visit: Payer: Self-pay | Admitting: Cardiology

## 2019-08-27 DIAGNOSIS — R55 Syncope and collapse: Secondary | ICD-10-CM | POA: Diagnosis not present

## 2019-08-28 ENCOUNTER — Telehealth: Payer: Self-pay

## 2019-08-28 NOTE — Telephone Encounter (Signed)
Tried calling patient. No answer and voicemail is full so I can not leave a message at this time.  

## 2019-08-28 NOTE — Telephone Encounter (Signed)
-----   Message from Baldo Daub, MD sent at 08/28/2019  1:45 PM EDT ----- Normal or stable result  I promised to call her I did but the voicemail box is not set up.  Please be sure I get credit for trying.  This recording is also normal and I told her we let her know and she does not need an implanted loop recorder

## 2019-08-29 ENCOUNTER — Telehealth: Payer: Self-pay

## 2019-08-29 NOTE — Telephone Encounter (Signed)
Tried calling patient. No answer and no voicemail set up for me to leave a message. 

## 2019-08-29 NOTE — Telephone Encounter (Signed)
-----   Message from Brian J Munley, MD sent at 08/28/2019  1:45 PM EDT ----- Normal or stable result  I promised to call her I did but the voicemail box is not set up.  Please be sure I get credit for trying.  This recording is also normal and I told her we let her know and she does not need an implanted loop recorder 

## 2019-09-01 NOTE — Telephone Encounter (Signed)
Spoke with patient regarding results and recommendation.  Patient verbalizes understanding and is agreeable to plan of care. Advised patient to call back with any issues or concerns.  

## 2019-09-01 NOTE — Telephone Encounter (Signed)
-----   Message from Brian J Munley, MD sent at 08/28/2019  1:45 PM EDT ----- Normal or stable result  I promised to call her I did but the voicemail box is not set up.  Please be sure I get credit for trying.  This recording is also normal and I told her we let her know and she does not need an implanted loop recorder 

## 2019-09-02 DIAGNOSIS — N390 Urinary tract infection, site not specified: Secondary | ICD-10-CM | POA: Diagnosis not present

## 2019-09-10 DIAGNOSIS — Z139 Encounter for screening, unspecified: Secondary | ICD-10-CM | POA: Diagnosis not present

## 2019-09-10 DIAGNOSIS — I10 Essential (primary) hypertension: Secondary | ICD-10-CM | POA: Diagnosis not present

## 2019-09-10 DIAGNOSIS — Z6823 Body mass index (BMI) 23.0-23.9, adult: Secondary | ICD-10-CM | POA: Diagnosis not present

## 2019-09-10 DIAGNOSIS — E063 Autoimmune thyroiditis: Secondary | ICD-10-CM | POA: Diagnosis not present

## 2019-09-10 DIAGNOSIS — E785 Hyperlipidemia, unspecified: Secondary | ICD-10-CM | POA: Diagnosis not present

## 2019-09-10 DIAGNOSIS — R739 Hyperglycemia, unspecified: Secondary | ICD-10-CM | POA: Diagnosis not present

## 2019-09-10 DIAGNOSIS — E559 Vitamin D deficiency, unspecified: Secondary | ICD-10-CM | POA: Diagnosis not present

## 2019-09-10 DIAGNOSIS — N390 Urinary tract infection, site not specified: Secondary | ICD-10-CM | POA: Diagnosis not present

## 2019-09-10 DIAGNOSIS — E538 Deficiency of other specified B group vitamins: Secondary | ICD-10-CM | POA: Diagnosis not present

## 2019-11-12 DIAGNOSIS — F3341 Major depressive disorder, recurrent, in partial remission: Secondary | ICD-10-CM | POA: Diagnosis not present

## 2019-11-12 DIAGNOSIS — E559 Vitamin D deficiency, unspecified: Secondary | ICD-10-CM | POA: Diagnosis not present

## 2019-11-12 DIAGNOSIS — N390 Urinary tract infection, site not specified: Secondary | ICD-10-CM | POA: Diagnosis not present

## 2019-11-12 DIAGNOSIS — R739 Hyperglycemia, unspecified: Secondary | ICD-10-CM | POA: Diagnosis not present

## 2019-11-12 DIAGNOSIS — Z6823 Body mass index (BMI) 23.0-23.9, adult: Secondary | ICD-10-CM | POA: Diagnosis not present

## 2019-11-12 DIAGNOSIS — I10 Essential (primary) hypertension: Secondary | ICD-10-CM | POA: Diagnosis not present

## 2019-11-12 DIAGNOSIS — E785 Hyperlipidemia, unspecified: Secondary | ICD-10-CM | POA: Diagnosis not present

## 2019-11-12 DIAGNOSIS — E063 Autoimmune thyroiditis: Secondary | ICD-10-CM | POA: Diagnosis not present

## 2019-12-11 DIAGNOSIS — E785 Hyperlipidemia, unspecified: Secondary | ICD-10-CM | POA: Diagnosis not present

## 2019-12-11 DIAGNOSIS — R739 Hyperglycemia, unspecified: Secondary | ICD-10-CM | POA: Diagnosis not present

## 2019-12-11 DIAGNOSIS — E063 Autoimmune thyroiditis: Secondary | ICD-10-CM | POA: Diagnosis not present

## 2019-12-18 DIAGNOSIS — Z87448 Personal history of other diseases of urinary system: Secondary | ICD-10-CM | POA: Diagnosis not present

## 2019-12-18 DIAGNOSIS — R944 Abnormal results of kidney function studies: Secondary | ICD-10-CM | POA: Diagnosis not present

## 2019-12-26 DIAGNOSIS — R944 Abnormal results of kidney function studies: Secondary | ICD-10-CM | POA: Diagnosis not present

## 2020-01-29 DIAGNOSIS — N390 Urinary tract infection, site not specified: Secondary | ICD-10-CM | POA: Diagnosis not present

## 2020-02-15 DIAGNOSIS — R300 Dysuria: Secondary | ICD-10-CM | POA: Diagnosis not present

## 2020-03-05 DIAGNOSIS — R350 Frequency of micturition: Secondary | ICD-10-CM | POA: Diagnosis not present

## 2020-03-23 DIAGNOSIS — R300 Dysuria: Secondary | ICD-10-CM | POA: Diagnosis not present

## 2020-03-23 DIAGNOSIS — N390 Urinary tract infection, site not specified: Secondary | ICD-10-CM | POA: Diagnosis not present

## 2020-03-23 DIAGNOSIS — Z6824 Body mass index (BMI) 24.0-24.9, adult: Secondary | ICD-10-CM | POA: Diagnosis not present

## 2020-03-23 DIAGNOSIS — R8290 Unspecified abnormal findings in urine: Secondary | ICD-10-CM | POA: Diagnosis not present

## 2020-04-06 DIAGNOSIS — N390 Urinary tract infection, site not specified: Secondary | ICD-10-CM | POA: Diagnosis not present

## 2020-04-21 IMAGING — MG DIGITAL SCREENING BILAT W/ TOMO W/ CAD
8 series · 8 of 24 positions shown · non-contrast
Comparison: Previous exam(s).

CLINICAL DATA: Screening.

EXAM:
DIGITAL SCREENING BILATERAL MAMMOGRAM WITH TOMO AND CAD

[R MLO synth-2D]
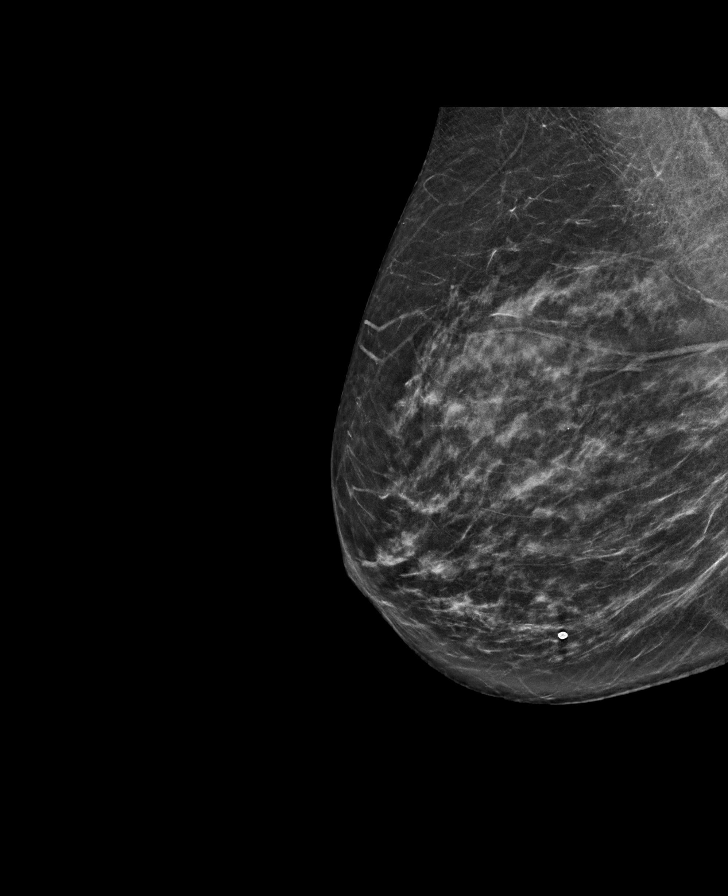

[L MLO synth-2D]
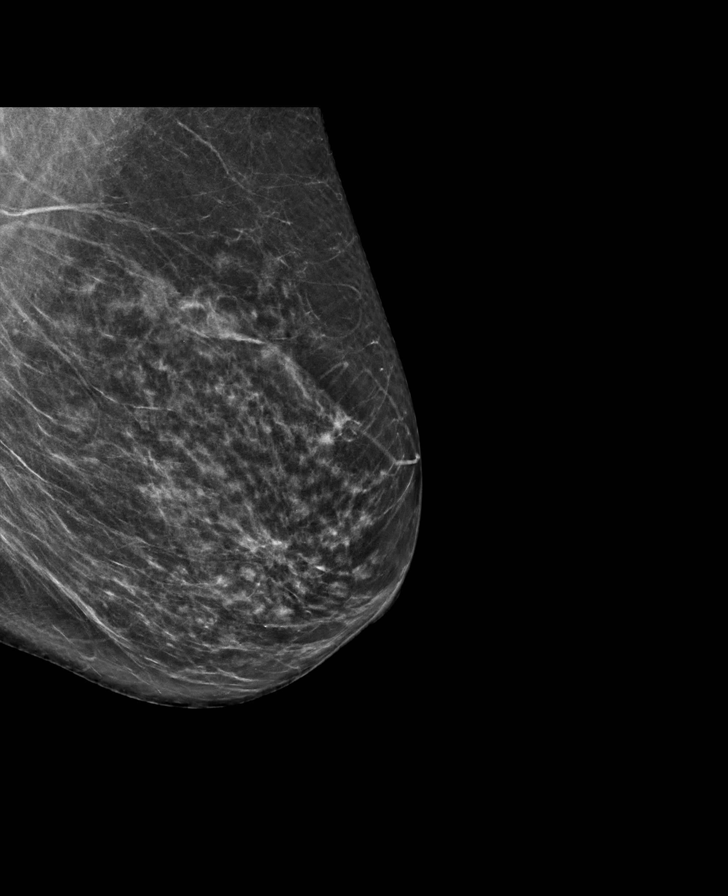

[L CC synth-2D]
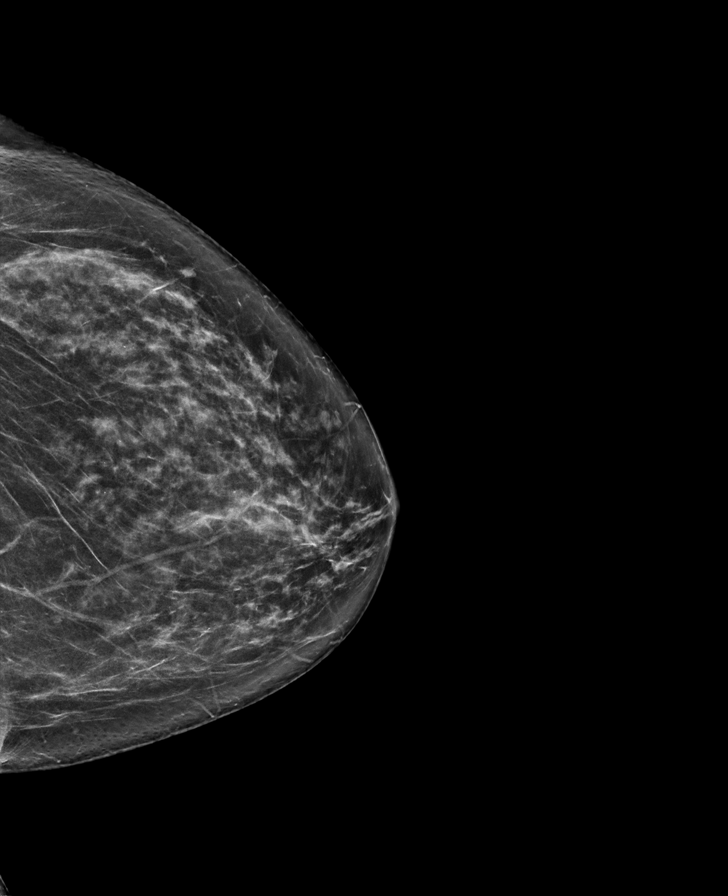

[R CC synth-2D]
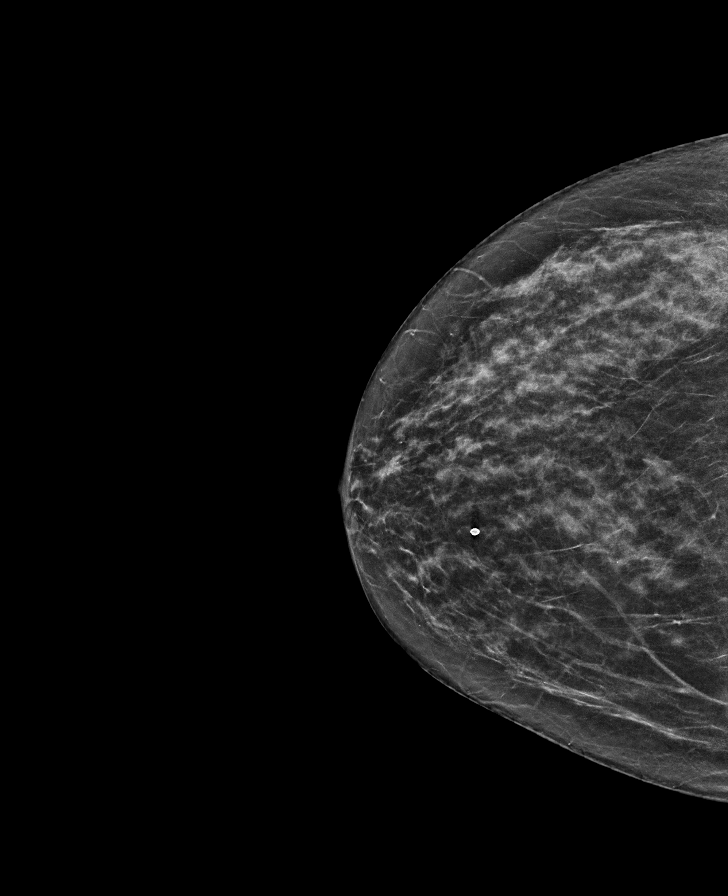

[R CC tomo · tomo slice 30/59.0]
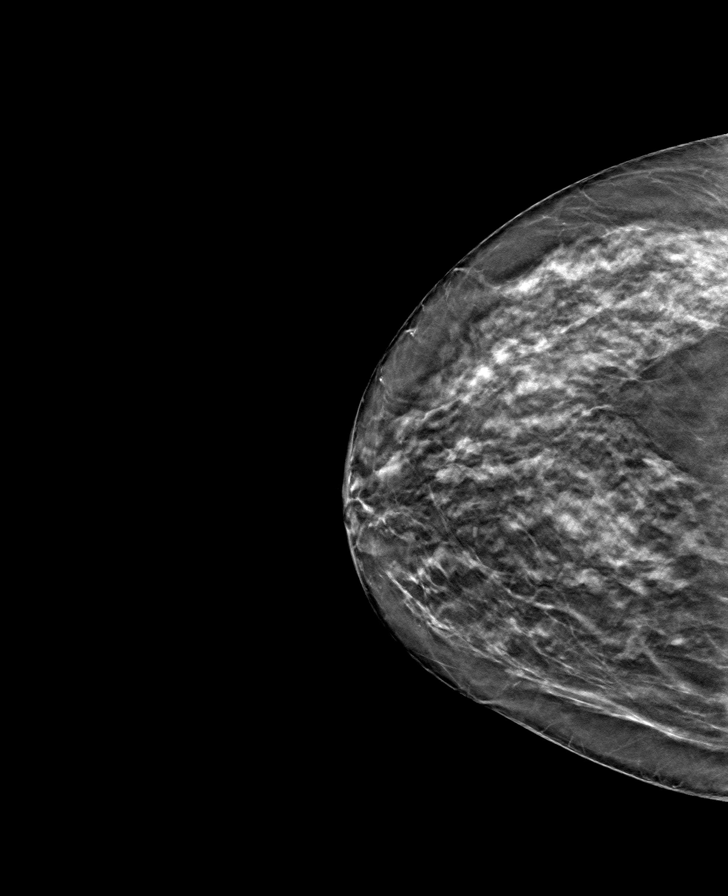

[R MLO tomo · tomo slice 32/63.0]
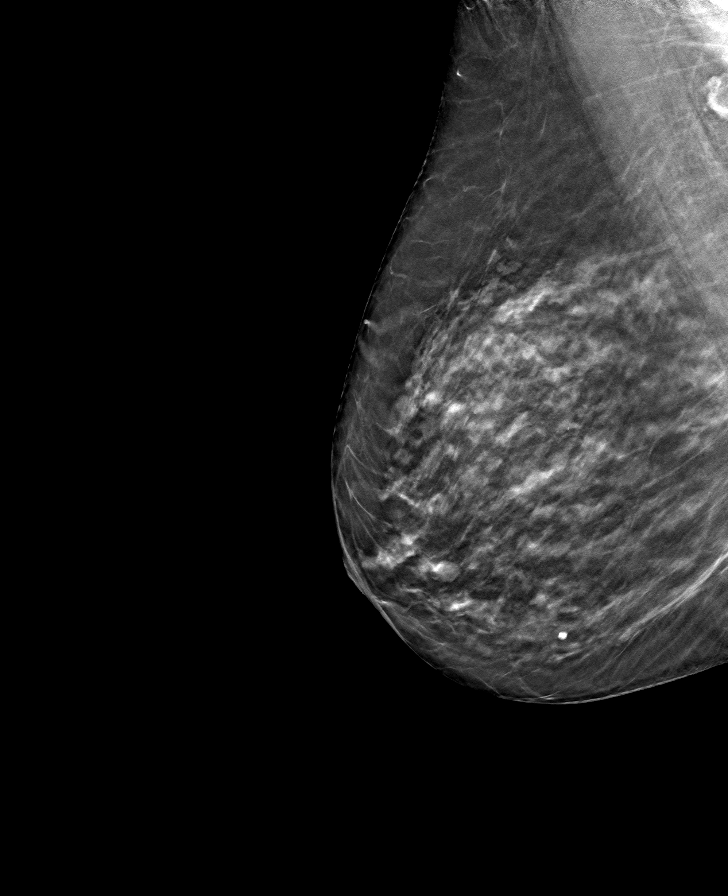

[L CC tomo · tomo slice 35/69.0]
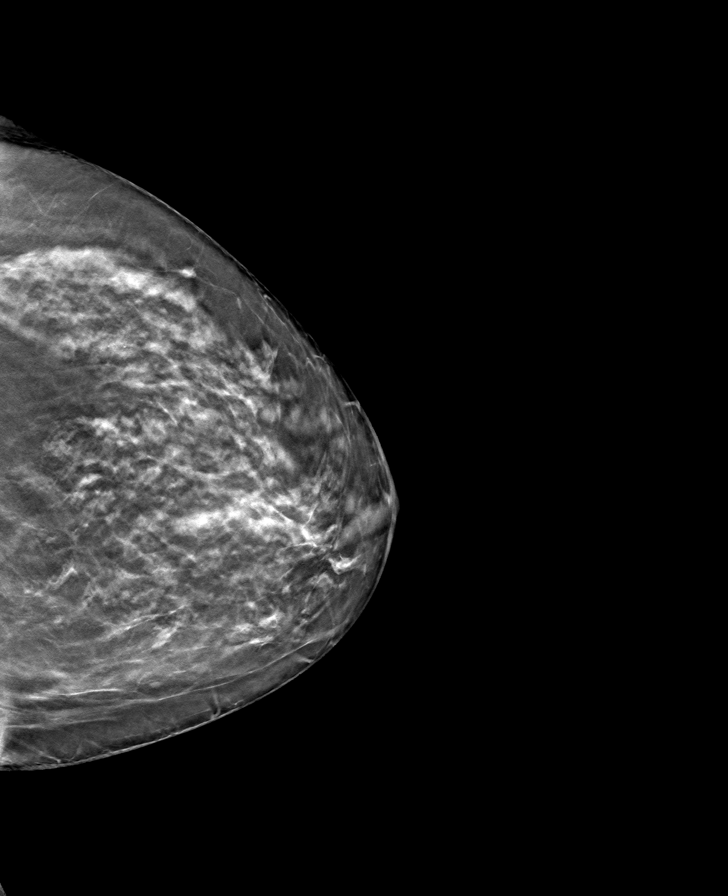

[L MLO tomo · tomo slice 34/67.0]
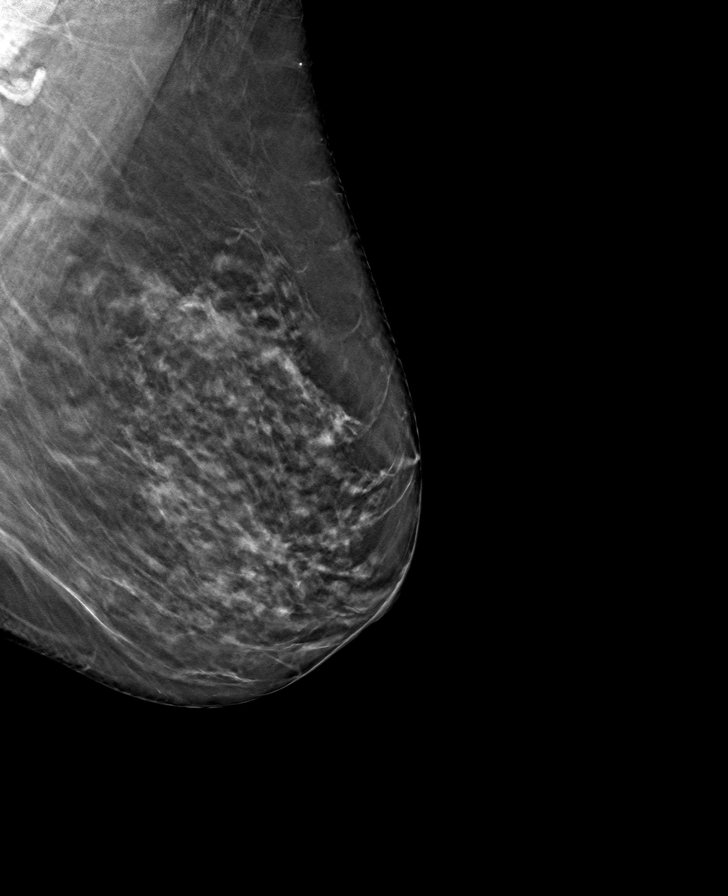

[8 of 24 positions shown; findings below may reference images not displayed]

ACR Breast Density Category c: The breast tissue is heterogeneously
dense, which may obscure small masses.
FINDINGS: There are no findings suspicious for malignancy. Images were
processed with CAD.
IMPRESSION: No mammographic evidence of malignancy. A result letter of this
screening mammogram will be mailed directly to the patient.

RECOMMENDATION:
Screening mammogram in one year. (Code:FT-U-LHB)

BI-RADS CATEGORY  1: Negative.

## 2020-04-30 DIAGNOSIS — N390 Urinary tract infection, site not specified: Secondary | ICD-10-CM | POA: Diagnosis not present

## 2020-04-30 DIAGNOSIS — K5900 Constipation, unspecified: Secondary | ICD-10-CM | POA: Diagnosis not present

## 2020-07-21 DIAGNOSIS — N8111 Cystocele, midline: Secondary | ICD-10-CM | POA: Diagnosis not present

## 2020-07-21 DIAGNOSIS — N952 Postmenopausal atrophic vaginitis: Secondary | ICD-10-CM | POA: Diagnosis not present

## 2020-07-21 DIAGNOSIS — Z87440 Personal history of urinary (tract) infections: Secondary | ICD-10-CM | POA: Diagnosis not present

## 2020-07-21 DIAGNOSIS — R82998 Other abnormal findings in urine: Secondary | ICD-10-CM | POA: Diagnosis not present

## 2020-07-30 DIAGNOSIS — N390 Urinary tract infection, site not specified: Secondary | ICD-10-CM | POA: Diagnosis not present

## 2020-08-02 DIAGNOSIS — F3341 Major depressive disorder, recurrent, in partial remission: Secondary | ICD-10-CM | POA: Diagnosis not present

## 2020-08-02 DIAGNOSIS — Z6823 Body mass index (BMI) 23.0-23.9, adult: Secondary | ICD-10-CM | POA: Diagnosis not present

## 2020-08-02 DIAGNOSIS — Z23 Encounter for immunization: Secondary | ICD-10-CM | POA: Diagnosis not present

## 2020-08-02 DIAGNOSIS — Z79899 Other long term (current) drug therapy: Secondary | ICD-10-CM | POA: Diagnosis not present

## 2020-08-02 DIAGNOSIS — E785 Hyperlipidemia, unspecified: Secondary | ICD-10-CM | POA: Diagnosis not present

## 2020-08-02 DIAGNOSIS — R739 Hyperglycemia, unspecified: Secondary | ICD-10-CM | POA: Diagnosis not present

## 2020-08-02 DIAGNOSIS — I10 Essential (primary) hypertension: Secondary | ICD-10-CM | POA: Diagnosis not present

## 2020-08-02 DIAGNOSIS — E559 Vitamin D deficiency, unspecified: Secondary | ICD-10-CM | POA: Diagnosis not present

## 2020-08-02 DIAGNOSIS — E063 Autoimmune thyroiditis: Secondary | ICD-10-CM | POA: Diagnosis not present

## 2020-08-02 DIAGNOSIS — E538 Deficiency of other specified B group vitamins: Secondary | ICD-10-CM | POA: Diagnosis not present

## 2020-09-16 DIAGNOSIS — B370 Candidal stomatitis: Secondary | ICD-10-CM | POA: Diagnosis not present

## 2020-09-16 DIAGNOSIS — N390 Urinary tract infection, site not specified: Secondary | ICD-10-CM | POA: Diagnosis not present

## 2020-09-16 DIAGNOSIS — B373 Candidiasis of vulva and vagina: Secondary | ICD-10-CM | POA: Diagnosis not present

## 2020-09-16 DIAGNOSIS — J301 Allergic rhinitis due to pollen: Secondary | ICD-10-CM | POA: Diagnosis not present

## 2020-09-16 DIAGNOSIS — Z6824 Body mass index (BMI) 24.0-24.9, adult: Secondary | ICD-10-CM | POA: Diagnosis not present

## 2020-09-16 DIAGNOSIS — H1010 Acute atopic conjunctivitis, unspecified eye: Secondary | ICD-10-CM | POA: Diagnosis not present

## 2020-09-20 DIAGNOSIS — L299 Pruritus, unspecified: Secondary | ICD-10-CM | POA: Diagnosis not present

## 2020-09-20 DIAGNOSIS — L300 Nummular dermatitis: Secondary | ICD-10-CM | POA: Diagnosis not present

## 2020-10-27 DIAGNOSIS — Z87440 Personal history of urinary (tract) infections: Secondary | ICD-10-CM | POA: Diagnosis not present

## 2020-10-27 DIAGNOSIS — N812 Incomplete uterovaginal prolapse: Secondary | ICD-10-CM | POA: Diagnosis not present

## 2020-11-09 DIAGNOSIS — N390 Urinary tract infection, site not specified: Secondary | ICD-10-CM | POA: Diagnosis not present

## 2020-12-21 ENCOUNTER — Other Ambulatory Visit: Payer: Self-pay | Admitting: Medical

## 2020-12-21 DIAGNOSIS — Z1231 Encounter for screening mammogram for malignant neoplasm of breast: Secondary | ICD-10-CM

## 2021-01-17 ENCOUNTER — Other Ambulatory Visit: Payer: Self-pay

## 2021-01-17 ENCOUNTER — Ambulatory Visit
Admission: RE | Admit: 2021-01-17 | Discharge: 2021-01-17 | Disposition: A | Discharge status: Home / Self Care | Payer: Medicare PPO | Source: Ambulatory Visit | Attending: Family Medicine | Admitting: Family Medicine

## 2021-01-17 DIAGNOSIS — Z1231 Encounter for screening mammogram for malignant neoplasm of breast: Secondary | ICD-10-CM

## 2021-04-04 DIAGNOSIS — K5900 Constipation, unspecified: Secondary | ICD-10-CM | POA: Diagnosis not present

## 2021-04-04 DIAGNOSIS — R12 Heartburn: Secondary | ICD-10-CM | POA: Diagnosis not present

## 2021-04-04 DIAGNOSIS — R1310 Dysphagia, unspecified: Secondary | ICD-10-CM | POA: Diagnosis not present

## 2021-05-10 DIAGNOSIS — N390 Urinary tract infection, site not specified: Secondary | ICD-10-CM | POA: Diagnosis not present

## 2021-05-28 DIAGNOSIS — R309 Painful micturition, unspecified: Secondary | ICD-10-CM | POA: Diagnosis not present

## 2021-06-15 DIAGNOSIS — Z9181 History of falling: Secondary | ICD-10-CM | POA: Diagnosis not present

## 2021-06-15 DIAGNOSIS — N390 Urinary tract infection, site not specified: Secondary | ICD-10-CM | POA: Diagnosis not present

## 2021-06-15 DIAGNOSIS — F3341 Major depressive disorder, recurrent, in partial remission: Secondary | ICD-10-CM | POA: Diagnosis not present

## 2021-06-15 DIAGNOSIS — Z1331 Encounter for screening for depression: Secondary | ICD-10-CM | POA: Diagnosis not present

## 2021-06-15 DIAGNOSIS — I10 Essential (primary) hypertension: Secondary | ICD-10-CM | POA: Diagnosis not present

## 2021-06-15 DIAGNOSIS — G4700 Insomnia, unspecified: Secondary | ICD-10-CM | POA: Diagnosis not present

## 2021-06-15 DIAGNOSIS — E782 Mixed hyperlipidemia: Secondary | ICD-10-CM | POA: Diagnosis not present

## 2021-06-15 DIAGNOSIS — R739 Hyperglycemia, unspecified: Secondary | ICD-10-CM | POA: Diagnosis not present

## 2021-06-15 DIAGNOSIS — Z139 Encounter for screening, unspecified: Secondary | ICD-10-CM | POA: Diagnosis not present

## 2021-06-15 DIAGNOSIS — E034 Atrophy of thyroid (acquired): Secondary | ICD-10-CM | POA: Diagnosis not present

## 2021-06-27 DIAGNOSIS — N289 Disorder of kidney and ureter, unspecified: Secondary | ICD-10-CM | POA: Diagnosis not present

## 2021-07-05 DIAGNOSIS — R12 Heartburn: Secondary | ICD-10-CM | POA: Diagnosis not present

## 2021-07-05 DIAGNOSIS — K5900 Constipation, unspecified: Secondary | ICD-10-CM | POA: Diagnosis not present

## 2021-07-05 DIAGNOSIS — R1310 Dysphagia, unspecified: Secondary | ICD-10-CM | POA: Diagnosis not present

## 2021-07-11 DIAGNOSIS — R944 Abnormal results of kidney function studies: Secondary | ICD-10-CM | POA: Diagnosis not present

## 2021-09-01 DIAGNOSIS — G2581 Restless legs syndrome: Secondary | ICD-10-CM | POA: Diagnosis not present

## 2021-09-01 DIAGNOSIS — R600 Localized edema: Secondary | ICD-10-CM | POA: Diagnosis not present

## 2021-10-03 DIAGNOSIS — R3915 Urgency of urination: Secondary | ICD-10-CM | POA: Diagnosis not present

## 2021-10-03 DIAGNOSIS — N390 Urinary tract infection, site not specified: Secondary | ICD-10-CM | POA: Diagnosis not present

## 2021-10-10 DIAGNOSIS — G2581 Restless legs syndrome: Secondary | ICD-10-CM | POA: Diagnosis not present

## 2021-10-10 DIAGNOSIS — I83893 Varicose veins of bilateral lower extremities with other complications: Secondary | ICD-10-CM | POA: Diagnosis not present

## 2021-10-10 DIAGNOSIS — I10 Essential (primary) hypertension: Secondary | ICD-10-CM | POA: Diagnosis not present

## 2021-10-10 DIAGNOSIS — I872 Venous insufficiency (chronic) (peripheral): Secondary | ICD-10-CM | POA: Diagnosis not present

## 2021-10-10 DIAGNOSIS — R600 Localized edema: Secondary | ICD-10-CM | POA: Diagnosis not present

## 2021-11-17 DIAGNOSIS — I872 Venous insufficiency (chronic) (peripheral): Secondary | ICD-10-CM | POA: Diagnosis not present

## 2021-11-28 DIAGNOSIS — Z09 Encounter for follow-up examination after completed treatment for conditions other than malignant neoplasm: Secondary | ICD-10-CM | POA: Diagnosis not present

## 2021-11-28 DIAGNOSIS — I83892 Varicose veins of left lower extremities with other complications: Secondary | ICD-10-CM | POA: Diagnosis not present

## 2021-12-16 DIAGNOSIS — E034 Atrophy of thyroid (acquired): Secondary | ICD-10-CM | POA: Diagnosis not present

## 2021-12-16 DIAGNOSIS — F3341 Major depressive disorder, recurrent, in partial remission: Secondary | ICD-10-CM | POA: Diagnosis not present

## 2021-12-16 DIAGNOSIS — E782 Mixed hyperlipidemia: Secondary | ICD-10-CM | POA: Diagnosis not present

## 2021-12-16 DIAGNOSIS — G4700 Insomnia, unspecified: Secondary | ICD-10-CM | POA: Diagnosis not present

## 2021-12-16 DIAGNOSIS — Z6823 Body mass index (BMI) 23.0-23.9, adult: Secondary | ICD-10-CM | POA: Diagnosis not present

## 2021-12-16 DIAGNOSIS — I10 Essential (primary) hypertension: Secondary | ICD-10-CM | POA: Diagnosis not present

## 2022-01-05 DIAGNOSIS — N390 Urinary tract infection, site not specified: Secondary | ICD-10-CM | POA: Diagnosis not present

## 2022-01-19 DIAGNOSIS — M5416 Radiculopathy, lumbar region: Secondary | ICD-10-CM | POA: Diagnosis not present

## 2022-01-19 DIAGNOSIS — M47816 Spondylosis without myelopathy or radiculopathy, lumbar region: Secondary | ICD-10-CM | POA: Diagnosis not present

## 2022-01-20 ENCOUNTER — Other Ambulatory Visit: Payer: Self-pay | Admitting: Rehabilitation

## 2022-01-20 DIAGNOSIS — M5416 Radiculopathy, lumbar region: Secondary | ICD-10-CM

## 2022-02-05 ENCOUNTER — Ambulatory Visit
Admission: RE | Admit: 2022-02-05 | Discharge: 2022-02-05 | Disposition: A | Discharge status: Home / Self Care | Payer: Medicare PPO | Source: Ambulatory Visit | Attending: Rehabilitation | Admitting: Rehabilitation

## 2022-02-05 DIAGNOSIS — M545 Low back pain, unspecified: Secondary | ICD-10-CM | POA: Diagnosis not present

## 2022-02-05 DIAGNOSIS — M5416 Radiculopathy, lumbar region: Secondary | ICD-10-CM

## 2022-02-05 DIAGNOSIS — M48061 Spinal stenosis, lumbar region without neurogenic claudication: Secondary | ICD-10-CM | POA: Diagnosis not present

## 2022-02-09 DIAGNOSIS — M47816 Spondylosis without myelopathy or radiculopathy, lumbar region: Secondary | ICD-10-CM | POA: Diagnosis not present

## 2022-02-09 DIAGNOSIS — M5416 Radiculopathy, lumbar region: Secondary | ICD-10-CM | POA: Diagnosis not present

## 2022-03-20 DIAGNOSIS — M5416 Radiculopathy, lumbar region: Secondary | ICD-10-CM | POA: Diagnosis not present

## 2022-04-10 DIAGNOSIS — M5416 Radiculopathy, lumbar region: Secondary | ICD-10-CM | POA: Diagnosis not present

## 2022-04-10 DIAGNOSIS — M47816 Spondylosis without myelopathy or radiculopathy, lumbar region: Secondary | ICD-10-CM | POA: Diagnosis not present

## 2022-05-11 DIAGNOSIS — N390 Urinary tract infection, site not specified: Secondary | ICD-10-CM | POA: Diagnosis not present

## 2022-05-15 DIAGNOSIS — Z6823 Body mass index (BMI) 23.0-23.9, adult: Secondary | ICD-10-CM | POA: Diagnosis not present

## 2022-05-15 DIAGNOSIS — H10023 Other mucopurulent conjunctivitis, bilateral: Secondary | ICD-10-CM | POA: Diagnosis not present

## 2022-06-02 DIAGNOSIS — M47816 Spondylosis without myelopathy or radiculopathy, lumbar region: Secondary | ICD-10-CM | POA: Diagnosis not present

## 2022-06-02 DIAGNOSIS — M7918 Myalgia, other site: Secondary | ICD-10-CM | POA: Diagnosis not present

## 2022-06-02 DIAGNOSIS — M5416 Radiculopathy, lumbar region: Secondary | ICD-10-CM | POA: Diagnosis not present

## 2022-06-19 DIAGNOSIS — F3341 Major depressive disorder, recurrent, in partial remission: Secondary | ICD-10-CM | POA: Diagnosis not present

## 2022-06-19 DIAGNOSIS — E034 Atrophy of thyroid (acquired): Secondary | ICD-10-CM | POA: Diagnosis not present

## 2022-06-19 DIAGNOSIS — Z6823 Body mass index (BMI) 23.0-23.9, adult: Secondary | ICD-10-CM | POA: Diagnosis not present

## 2022-06-19 DIAGNOSIS — I10 Essential (primary) hypertension: Secondary | ICD-10-CM | POA: Diagnosis not present

## 2022-06-19 DIAGNOSIS — E782 Mixed hyperlipidemia: Secondary | ICD-10-CM | POA: Diagnosis not present

## 2022-07-03 DIAGNOSIS — M5416 Radiculopathy, lumbar region: Secondary | ICD-10-CM | POA: Diagnosis not present

## 2022-07-28 DIAGNOSIS — M47816 Spondylosis without myelopathy or radiculopathy, lumbar region: Secondary | ICD-10-CM | POA: Diagnosis not present

## 2022-07-28 DIAGNOSIS — M5416 Radiculopathy, lumbar region: Secondary | ICD-10-CM | POA: Diagnosis not present

## 2022-07-28 DIAGNOSIS — M7062 Trochanteric bursitis, left hip: Secondary | ICD-10-CM | POA: Diagnosis not present

## 2022-08-25 DIAGNOSIS — M5416 Radiculopathy, lumbar region: Secondary | ICD-10-CM | POA: Diagnosis not present

## 2022-08-25 DIAGNOSIS — M7072 Other bursitis of hip, left hip: Secondary | ICD-10-CM | POA: Diagnosis not present

## 2022-08-29 DIAGNOSIS — M7062 Trochanteric bursitis, left hip: Secondary | ICD-10-CM | POA: Diagnosis not present

## 2022-08-29 DIAGNOSIS — M7072 Other bursitis of hip, left hip: Secondary | ICD-10-CM | POA: Diagnosis not present

## 2022-08-29 DIAGNOSIS — M25552 Pain in left hip: Secondary | ICD-10-CM | POA: Diagnosis not present

## 2022-09-29 DIAGNOSIS — M7072 Other bursitis of hip, left hip: Secondary | ICD-10-CM | POA: Diagnosis not present

## 2022-09-29 DIAGNOSIS — M5416 Radiculopathy, lumbar region: Secondary | ICD-10-CM | POA: Diagnosis not present

## 2022-10-05 DIAGNOSIS — Z6822 Body mass index (BMI) 22.0-22.9, adult: Secondary | ICD-10-CM | POA: Diagnosis not present

## 2022-10-05 DIAGNOSIS — S61219A Laceration without foreign body of unspecified finger without damage to nail, initial encounter: Secondary | ICD-10-CM | POA: Diagnosis not present

## 2023-01-04 DIAGNOSIS — Z6823 Body mass index (BMI) 23.0-23.9, adult: Secondary | ICD-10-CM | POA: Diagnosis not present

## 2023-01-04 DIAGNOSIS — E782 Mixed hyperlipidemia: Secondary | ICD-10-CM | POA: Diagnosis not present

## 2023-01-04 DIAGNOSIS — I129 Hypertensive chronic kidney disease with stage 1 through stage 4 chronic kidney disease, or unspecified chronic kidney disease: Secondary | ICD-10-CM | POA: Diagnosis not present

## 2023-01-04 DIAGNOSIS — Z139 Encounter for screening, unspecified: Secondary | ICD-10-CM | POA: Diagnosis not present

## 2023-01-04 DIAGNOSIS — E034 Atrophy of thyroid (acquired): Secondary | ICD-10-CM | POA: Diagnosis not present

## 2023-01-04 DIAGNOSIS — Z9181 History of falling: Secondary | ICD-10-CM | POA: Diagnosis not present

## 2023-01-04 DIAGNOSIS — G4700 Insomnia, unspecified: Secondary | ICD-10-CM | POA: Diagnosis not present

## 2023-01-04 DIAGNOSIS — F3341 Major depressive disorder, recurrent, in partial remission: Secondary | ICD-10-CM | POA: Diagnosis not present

## 2023-01-08 DIAGNOSIS — M7632 Iliotibial band syndrome, left leg: Secondary | ICD-10-CM | POA: Diagnosis not present

## 2023-01-08 DIAGNOSIS — M51369 Other intervertebral disc degeneration, lumbar region without mention of lumbar back pain or lower extremity pain: Secondary | ICD-10-CM | POA: Diagnosis not present

## 2023-01-08 DIAGNOSIS — M7062 Trochanteric bursitis, left hip: Secondary | ICD-10-CM | POA: Diagnosis not present

## 2023-01-08 DIAGNOSIS — M25552 Pain in left hip: Secondary | ICD-10-CM | POA: Diagnosis not present

## 2023-01-08 DIAGNOSIS — M549 Dorsalgia, unspecified: Secondary | ICD-10-CM | POA: Diagnosis not present

## 2023-01-18 DIAGNOSIS — Z1231 Encounter for screening mammogram for malignant neoplasm of breast: Secondary | ICD-10-CM | POA: Diagnosis not present

## 2023-01-18 DIAGNOSIS — R928 Other abnormal and inconclusive findings on diagnostic imaging of breast: Secondary | ICD-10-CM | POA: Diagnosis not present

## 2023-01-24 DIAGNOSIS — Z9181 History of falling: Secondary | ICD-10-CM | POA: Diagnosis not present

## 2023-01-24 DIAGNOSIS — Z0000 Encounter for general adult medical examination without abnormal findings: Secondary | ICD-10-CM | POA: Diagnosis not present

## 2023-02-02 DIAGNOSIS — M7062 Trochanteric bursitis, left hip: Secondary | ICD-10-CM | POA: Diagnosis not present

## 2023-02-02 DIAGNOSIS — M7632 Iliotibial band syndrome, left leg: Secondary | ICD-10-CM | POA: Diagnosis not present

## 2023-02-02 DIAGNOSIS — M51361 Other intervertebral disc degeneration, lumbar region with lower extremity pain only: Secondary | ICD-10-CM | POA: Diagnosis not present

## 2023-02-06 DIAGNOSIS — M51361 Other intervertebral disc degeneration, lumbar region with lower extremity pain only: Secondary | ICD-10-CM | POA: Diagnosis not present

## 2023-02-06 DIAGNOSIS — M7632 Iliotibial band syndrome, left leg: Secondary | ICD-10-CM | POA: Diagnosis not present

## 2023-02-06 DIAGNOSIS — M7062 Trochanteric bursitis, left hip: Secondary | ICD-10-CM | POA: Diagnosis not present

## 2023-02-12 DIAGNOSIS — R928 Other abnormal and inconclusive findings on diagnostic imaging of breast: Secondary | ICD-10-CM | POA: Diagnosis not present

## 2023-02-16 DIAGNOSIS — M7632 Iliotibial band syndrome, left leg: Secondary | ICD-10-CM | POA: Diagnosis not present

## 2023-02-16 DIAGNOSIS — M7062 Trochanteric bursitis, left hip: Secondary | ICD-10-CM | POA: Diagnosis not present

## 2023-02-16 DIAGNOSIS — M25552 Pain in left hip: Secondary | ICD-10-CM | POA: Diagnosis not present

## 2023-02-16 DIAGNOSIS — M51361 Other intervertebral disc degeneration, lumbar region with lower extremity pain only: Secondary | ICD-10-CM | POA: Diagnosis not present

## 2023-02-19 DIAGNOSIS — M51361 Other intervertebral disc degeneration, lumbar region with lower extremity pain only: Secondary | ICD-10-CM | POA: Diagnosis not present

## 2023-02-19 DIAGNOSIS — M7632 Iliotibial band syndrome, left leg: Secondary | ICD-10-CM | POA: Diagnosis not present

## 2023-02-19 DIAGNOSIS — M25552 Pain in left hip: Secondary | ICD-10-CM | POA: Diagnosis not present

## 2023-02-19 DIAGNOSIS — M7062 Trochanteric bursitis, left hip: Secondary | ICD-10-CM | POA: Diagnosis not present

## 2023-02-21 DIAGNOSIS — M25552 Pain in left hip: Secondary | ICD-10-CM | POA: Diagnosis not present

## 2023-02-21 DIAGNOSIS — M51361 Other intervertebral disc degeneration, lumbar region with lower extremity pain only: Secondary | ICD-10-CM | POA: Diagnosis not present

## 2023-02-21 DIAGNOSIS — M7062 Trochanteric bursitis, left hip: Secondary | ICD-10-CM | POA: Diagnosis not present

## 2023-02-21 DIAGNOSIS — M7632 Iliotibial band syndrome, left leg: Secondary | ICD-10-CM | POA: Diagnosis not present

## 2023-02-26 DIAGNOSIS — M7062 Trochanteric bursitis, left hip: Secondary | ICD-10-CM | POA: Diagnosis not present

## 2023-02-26 DIAGNOSIS — M25552 Pain in left hip: Secondary | ICD-10-CM | POA: Diagnosis not present

## 2023-02-26 DIAGNOSIS — M7632 Iliotibial band syndrome, left leg: Secondary | ICD-10-CM | POA: Diagnosis not present

## 2023-02-26 DIAGNOSIS — M51361 Other intervertebral disc degeneration, lumbar region with lower extremity pain only: Secondary | ICD-10-CM | POA: Diagnosis not present

## 2023-02-28 DIAGNOSIS — M7062 Trochanteric bursitis, left hip: Secondary | ICD-10-CM | POA: Diagnosis not present

## 2023-02-28 DIAGNOSIS — M7632 Iliotibial band syndrome, left leg: Secondary | ICD-10-CM | POA: Diagnosis not present

## 2023-02-28 DIAGNOSIS — M51361 Other intervertebral disc degeneration, lumbar region with lower extremity pain only: Secondary | ICD-10-CM | POA: Diagnosis not present

## 2023-02-28 DIAGNOSIS — M25552 Pain in left hip: Secondary | ICD-10-CM | POA: Diagnosis not present

## 2023-03-08 DIAGNOSIS — M7062 Trochanteric bursitis, left hip: Secondary | ICD-10-CM | POA: Diagnosis not present

## 2023-03-08 DIAGNOSIS — M25552 Pain in left hip: Secondary | ICD-10-CM | POA: Diagnosis not present

## 2023-03-08 DIAGNOSIS — M51361 Other intervertebral disc degeneration, lumbar region with lower extremity pain only: Secondary | ICD-10-CM | POA: Diagnosis not present

## 2023-03-08 DIAGNOSIS — M7632 Iliotibial band syndrome, left leg: Secondary | ICD-10-CM | POA: Diagnosis not present

## 2023-03-12 DIAGNOSIS — M51361 Other intervertebral disc degeneration, lumbar region with lower extremity pain only: Secondary | ICD-10-CM | POA: Diagnosis not present

## 2023-03-12 DIAGNOSIS — M25552 Pain in left hip: Secondary | ICD-10-CM | POA: Diagnosis not present

## 2023-03-12 DIAGNOSIS — M7632 Iliotibial band syndrome, left leg: Secondary | ICD-10-CM | POA: Diagnosis not present

## 2023-03-12 DIAGNOSIS — M7062 Trochanteric bursitis, left hip: Secondary | ICD-10-CM | POA: Diagnosis not present

## 2023-03-28 DIAGNOSIS — M25552 Pain in left hip: Secondary | ICD-10-CM | POA: Diagnosis not present

## 2023-03-28 DIAGNOSIS — M51361 Other intervertebral disc degeneration, lumbar region with lower extremity pain only: Secondary | ICD-10-CM | POA: Diagnosis not present

## 2023-03-28 DIAGNOSIS — M7632 Iliotibial band syndrome, left leg: Secondary | ICD-10-CM | POA: Diagnosis not present

## 2023-03-28 DIAGNOSIS — M7062 Trochanteric bursitis, left hip: Secondary | ICD-10-CM | POA: Diagnosis not present

## 2023-05-17 DIAGNOSIS — R3915 Urgency of urination: Secondary | ICD-10-CM | POA: Diagnosis not present

## 2023-05-17 DIAGNOSIS — N390 Urinary tract infection, site not specified: Secondary | ICD-10-CM | POA: Diagnosis not present

## 2023-07-03 DIAGNOSIS — E782 Mixed hyperlipidemia: Secondary | ICD-10-CM | POA: Diagnosis not present

## 2023-07-03 DIAGNOSIS — I129 Hypertensive chronic kidney disease with stage 1 through stage 4 chronic kidney disease, or unspecified chronic kidney disease: Secondary | ICD-10-CM | POA: Diagnosis not present

## 2023-07-03 DIAGNOSIS — N1831 Chronic kidney disease, stage 3a: Secondary | ICD-10-CM | POA: Diagnosis not present

## 2023-07-03 DIAGNOSIS — G4700 Insomnia, unspecified: Secondary | ICD-10-CM | POA: Diagnosis not present

## 2023-07-03 DIAGNOSIS — K219 Gastro-esophageal reflux disease without esophagitis: Secondary | ICD-10-CM | POA: Diagnosis not present

## 2023-07-03 DIAGNOSIS — E034 Atrophy of thyroid (acquired): Secondary | ICD-10-CM | POA: Diagnosis not present

## 2023-07-03 DIAGNOSIS — F3341 Major depressive disorder, recurrent, in partial remission: Secondary | ICD-10-CM | POA: Diagnosis not present

## 2023-07-03 DIAGNOSIS — B3731 Acute candidiasis of vulva and vagina: Secondary | ICD-10-CM | POA: Diagnosis not present

## 2023-07-03 DIAGNOSIS — E559 Vitamin D deficiency, unspecified: Secondary | ICD-10-CM | POA: Diagnosis not present

## 2023-07-03 DIAGNOSIS — E538 Deficiency of other specified B group vitamins: Secondary | ICD-10-CM | POA: Diagnosis not present

## 2023-09-13 DIAGNOSIS — H16143 Punctate keratitis, bilateral: Secondary | ICD-10-CM | POA: Diagnosis not present

## 2023-09-13 DIAGNOSIS — H5202 Hypermetropia, left eye: Secondary | ICD-10-CM | POA: Diagnosis not present

## 2023-09-13 DIAGNOSIS — H353131 Nonexudative age-related macular degeneration, bilateral, early dry stage: Secondary | ICD-10-CM | POA: Diagnosis not present

## 2023-09-13 DIAGNOSIS — H43813 Vitreous degeneration, bilateral: Secondary | ICD-10-CM | POA: Diagnosis not present

## 2023-12-13 DIAGNOSIS — Z2821 Immunization not carried out because of patient refusal: Secondary | ICD-10-CM | POA: Diagnosis not present

## 2023-12-13 DIAGNOSIS — G5792 Unspecified mononeuropathy of left lower limb: Secondary | ICD-10-CM | POA: Diagnosis not present

## 2023-12-13 DIAGNOSIS — G5692 Unspecified mononeuropathy of left upper limb: Secondary | ICD-10-CM | POA: Diagnosis not present

## 2023-12-13 DIAGNOSIS — Z6823 Body mass index (BMI) 23.0-23.9, adult: Secondary | ICD-10-CM | POA: Diagnosis not present

## 2023-12-18 DIAGNOSIS — G5792 Unspecified mononeuropathy of left lower limb: Secondary | ICD-10-CM | POA: Diagnosis not present

## 2023-12-18 DIAGNOSIS — R519 Headache, unspecified: Secondary | ICD-10-CM | POA: Diagnosis not present

## 2023-12-19 DIAGNOSIS — R519 Headache, unspecified: Secondary | ICD-10-CM | POA: Diagnosis not present

## 2023-12-31 DIAGNOSIS — G4700 Insomnia, unspecified: Secondary | ICD-10-CM | POA: Diagnosis not present

## 2023-12-31 DIAGNOSIS — Z6823 Body mass index (BMI) 23.0-23.9, adult: Secondary | ICD-10-CM | POA: Diagnosis not present

## 2023-12-31 DIAGNOSIS — E034 Atrophy of thyroid (acquired): Secondary | ICD-10-CM | POA: Diagnosis not present

## 2023-12-31 DIAGNOSIS — I129 Hypertensive chronic kidney disease with stage 1 through stage 4 chronic kidney disease, or unspecified chronic kidney disease: Secondary | ICD-10-CM | POA: Diagnosis not present

## 2023-12-31 DIAGNOSIS — N1831 Chronic kidney disease, stage 3a: Secondary | ICD-10-CM | POA: Diagnosis not present

## 2023-12-31 DIAGNOSIS — M5432 Sciatica, left side: Secondary | ICD-10-CM | POA: Diagnosis not present

## 2023-12-31 DIAGNOSIS — E782 Mixed hyperlipidemia: Secondary | ICD-10-CM | POA: Diagnosis not present

## 2023-12-31 DIAGNOSIS — F3341 Major depressive disorder, recurrent, in partial remission: Secondary | ICD-10-CM | POA: Diagnosis not present

## 2023-12-31 DIAGNOSIS — K219 Gastro-esophageal reflux disease without esophagitis: Secondary | ICD-10-CM | POA: Diagnosis not present
